# Patient Record
Sex: Male | Born: 1960
Health system: Southern US, Community
[De-identification: ages and names within clinical notes are randomized; demographics above are authoritative.]

## PROBLEM LIST (undated history)

## (undated) DIAGNOSIS — T753XXA Motion sickness, initial encounter: Secondary | ICD-10-CM

## (undated) DIAGNOSIS — R42 Dizziness and giddiness: Secondary | ICD-10-CM

## (undated) HISTORY — PX: GANGLION CYST EXCISION: SHX1691

---

## 1999-10-16 HISTORY — PX: VASECTOMY: SHX75

## 2003-10-16 HISTORY — PX: KNEE SURGERY: SHX244

## 2011-12-07 ENCOUNTER — Ambulatory Visit: Payer: Self-pay

## 2012-05-15 HISTORY — PX: COLONOSCOPY: SHX174

## 2012-05-15 LAB — HM COLONOSCOPY

## 2012-06-06 ENCOUNTER — Ambulatory Visit: Payer: Self-pay | Admitting: Unknown Physician Specialty

## 2014-06-24 LAB — TSH: TSH: 1.6 u[IU]/mL (ref 0.41–5.90)

## 2014-06-24 LAB — LIPID PANEL
Cholesterol: 200 mg/dL (ref 0–200)
HDL: 70 mg/dL (ref 35–70)
LDL Cholesterol: 119 mg/dL
Triglycerides: 56 mg/dL (ref 40–160)

## 2014-06-24 LAB — CBC AND DIFFERENTIAL: Hemoglobin: 15.1 g/dL (ref 13.5–17.5)

## 2014-06-24 LAB — BASIC METABOLIC PANEL
BUN: 22 mg/dL — AB (ref 4–21)
Creatinine: 0.9 mg/dL (ref 0.6–1.3)

## 2014-06-24 LAB — PSA: PSA: 0.9

## 2015-09-20 ENCOUNTER — Encounter: Payer: Self-pay | Admitting: Internal Medicine

## 2015-09-20 DIAGNOSIS — E785 Hyperlipidemia, unspecified: Secondary | ICD-10-CM | POA: Insufficient documentation

## 2015-09-20 DIAGNOSIS — J3089 Other allergic rhinitis: Secondary | ICD-10-CM | POA: Insufficient documentation

## 2015-09-20 DIAGNOSIS — Z87898 Personal history of other specified conditions: Secondary | ICD-10-CM | POA: Insufficient documentation

## 2015-11-23 ENCOUNTER — Encounter: Payer: Self-pay | Admitting: Internal Medicine

## 2015-11-23 ENCOUNTER — Ambulatory Visit (INDEPENDENT_AMBULATORY_CARE_PROVIDER_SITE_OTHER): Payer: 59 | Admitting: Internal Medicine

## 2015-11-23 VITALS — BP 104/66 | HR 56 | Ht 71.0 in | Wt 163.6 lb

## 2015-11-23 DIAGNOSIS — Z125 Encounter for screening for malignant neoplasm of prostate: Secondary | ICD-10-CM

## 2015-11-23 DIAGNOSIS — E785 Hyperlipidemia, unspecified: Secondary | ICD-10-CM | POA: Diagnosis not present

## 2015-11-23 DIAGNOSIS — Z0000 Encounter for general adult medical examination without abnormal findings: Secondary | ICD-10-CM | POA: Diagnosis not present

## 2015-11-23 DIAGNOSIS — J3089 Other allergic rhinitis: Secondary | ICD-10-CM

## 2015-11-23 LAB — POCT URINALYSIS DIPSTICK
Bilirubin, UA: NEGATIVE
Blood, UA: NEGATIVE
Glucose, UA: NEGATIVE
Ketones, UA: NEGATIVE
Leukocytes, UA: NEGATIVE
Nitrite, UA: NEGATIVE
Protein, UA: NEGATIVE
Spec Grav, UA: 1.02
Urobilinogen, UA: 0.2
pH, UA: 6.5

## 2015-11-23 NOTE — Progress Notes (Signed)
Date:  11/23/2015   Name:  Hunter Morgan   DOB:  1961/09/08   MRN:  [id number]   Chief Complaint: Annual Exam Hunter Morgan is a 55 y.o. male who presents today for his Complete Annual Exam. He feels well. He reports exercising regularly. He reports he is sleeping about 6 hours per night.     Colonoscopy was done in 2013 - due in 2023.  He sees an ophthalmologist regularly.  He has not been bothered much by seasonal allergies.   Review of Systems  Constitutional: Negative for chills, diaphoresis, appetite change, fatigue and unexpected weight change.  HENT: Negative for hearing loss, tinnitus, trouble swallowing and voice change.   Eyes: Negative for visual disturbance.  Respiratory: Negative for choking, shortness of breath and wheezing.   Cardiovascular: Negative for chest pain, palpitations and leg swelling.  Gastrointestinal: Negative for abdominal pain, diarrhea, constipation and blood in stool.  Genitourinary: Negative for dysuria, urgency, frequency, hematuria and difficulty urinating.  Musculoskeletal: Positive for back pain (low back muscular pain). Negative for myalgias and arthralgias.  Skin: Negative for color change and rash.  Allergic/Immunologic: Negative for environmental allergies.  Neurological: Negative for dizziness, syncope and headaches.  Hematological: Negative for adenopathy.  Psychiatric/Behavioral: Positive for sleep disturbance (only sleeps 6 hours per night). Negative for dysphoric mood. The patient is not nervous/anxious.     Patient Active Problem List   Diagnosis Date Noted  . Environmental and seasonal allergies 09/20/2015  . H/O motion sickness 09/20/2015  . Hyperlipidemia, mild 09/20/2015    Prior to Admission medications   Not on File    No Known Allergies  Past Surgical History  Procedure Laterality Date  . Colonoscopy  05/2012    normal  . Knee surgery Left 2005  . Ganglion cyst excision Left     wrist  . Vasectomy  2001     Social History  Substance Use Topics  . Smoking status: Never Smoker   . Smokeless tobacco: None  . Alcohol Use: 2.4 oz/week    4 Standard drinks or equivalent per week     Medication list has been reviewed and updated.   Physical Exam  Constitutional: He is oriented to person, place, and time. He appears well-developed and well-nourished.  HENT:  Head: Normocephalic.  Right Ear: Tympanic membrane, external ear and ear canal normal.  Left Ear: Tympanic membrane, external ear and ear canal normal.  Nose: Nose normal.  Mouth/Throat: Uvula is midline and oropharynx is clear and moist.  Eyes: Conjunctivae and EOM are normal. Pupils are equal, round, and reactive to light.  Neck: Normal range of motion. Neck supple. Carotid bruit is not present. No thyromegaly present.  Cardiovascular: Normal rate, regular rhythm, normal heart sounds and intact distal pulses.   Pulmonary/Chest: Effort normal and breath sounds normal. He has no wheezes. Right breast exhibits no mass. Left breast exhibits no mass.  Abdominal: Soft. Normal appearance and bowel sounds are normal. There is no hepatosplenomegaly. There is no tenderness.  Musculoskeletal: Normal range of motion.  Lymphadenopathy:    He has no cervical adenopathy.  Neurological: He is alert and oriented to person, place, and time. He has normal reflexes.  Skin: Skin is warm, dry and intact.  Psychiatric: He has a normal mood and affect. His speech is normal and behavior is normal. Judgment and thought content normal.  Nursing note and vitals reviewed.   BP 104/66 mmHg  Pulse 56  Ht 5\' 11"  (1.803 m)  Wt 163 lb 9.6 oz (74.208 kg)  BMI 22.83 kg/m2  Assessment and Plan: 1. Annual physical exam Normal exam Patient is up-to-date on colonoscopy, eye exam and annual skin survey TDap discussed - CBC with Differential/Platelet - Comprehensive metabolic panel - POCT urinalysis dipstick  2. Hyperlipidemia, mild Continue to monitor and  advise if medications are needed - Lipid panel  3. Environmental and seasonal allergies Stable without medications needed at this time  4. Prostate cancer screening DRE deferred due to lack of symptoms - PSA   Halina Maidens, MD [facility]  11/23/2015

## 2015-11-24 LAB — CBC WITH DIFFERENTIAL/PLATELET
Basophils Absolute: 0 10*3/uL (ref 0.0–0.2)
Basos: 0 %
EOS (ABSOLUTE): 0.2 10*3/uL (ref 0.0–0.4)
Eos: 3 %
Hematocrit: 43 % (ref 37.5–51.0)
Hemoglobin: 14.6 g/dL (ref 12.6–17.7)
Immature Grans (Abs): 0 10*3/uL (ref 0.0–0.1)
Immature Granulocytes: 0 %
Lymphocytes Absolute: 1.6 10*3/uL (ref 0.7–3.1)
Lymphs: 28 %
MCH: 33.9 pg — ABNORMAL HIGH (ref 26.6–33.0)
MCHC: 34 g/dL (ref 31.5–35.7)
MCV: 100 fL — ABNORMAL HIGH (ref 79–97)
Monocytes Absolute: 0.7 10*3/uL (ref 0.1–0.9)
Monocytes: 12 %
Neutrophils Absolute: 3.2 10*3/uL (ref 1.4–7.0)
Neutrophils: 57 %
Platelets: 254 10*3/uL (ref 150–379)
RBC: 4.31 x10E6/uL (ref 4.14–5.80)
RDW: 13.9 % (ref 12.3–15.4)
WBC: 5.6 10*3/uL (ref 3.4–10.8)

## 2015-11-24 LAB — LIPID PANEL
Chol/HDL Ratio: 2.8 ratio units (ref 0.0–5.0)
Cholesterol, Total: 163 mg/dL (ref 100–199)
HDL: 59 mg/dL (ref >39)
LDL Calculated: 96 mg/dL (ref 0–99)
Triglycerides: 40 mg/dL (ref 0–149)
VLDL Cholesterol Cal: 8 mg/dL (ref 5–40)

## 2015-11-24 LAB — COMPREHENSIVE METABOLIC PANEL
ALT: 18 IU/L (ref 0–44)
AST: 24 IU/L (ref 0–40)
Albumin/Globulin Ratio: 1.8 (ref 1.1–2.5)
Albumin: 4.5 g/dL (ref 3.5–5.5)
Alkaline Phosphatase: 59 IU/L (ref 39–117)
BUN/Creatinine Ratio: 18 (ref 9–20)
BUN: 12 mg/dL (ref 6–24)
Bilirubin Total: 0.8 mg/dL (ref 0.0–1.2)
CO2: 26 mmol/L (ref 18–29)
Calcium: 9.1 mg/dL (ref 8.7–10.2)
Chloride: 97 mmol/L (ref 96–106)
Creatinine, Ser: 0.68 mg/dL — ABNORMAL LOW (ref 0.76–1.27)
GFR calc Af Amer: 125 mL/min/{1.73_m2} (ref >59)
GFR calc non Af Amer: 108 mL/min/{1.73_m2} (ref >59)
Globulin, Total: 2.5 g/dL (ref 1.5–4.5)
Glucose: 69 mg/dL (ref 65–99)
Potassium: 4.4 mmol/L (ref 3.5–5.2)
Sodium: 140 mmol/L (ref 134–144)
Total Protein: 7 g/dL (ref 6.0–8.5)

## 2015-11-24 LAB — PSA: Prostate Specific Ag, Serum: 1 ng/mL (ref 0.0–4.0)

## 2015-11-25 ENCOUNTER — Telehealth: Payer: Self-pay

## 2015-11-25 NOTE — Telephone Encounter (Signed)
-----   Message from Glean Hess, MD sent at 11/24/2015  1:02 PM EST ----- Labs are normal.  Cholesterol is excellent!  PSA is normal.

## 2015-11-25 NOTE — Telephone Encounter (Signed)
Spoke with patient. Patient advised of all results and verbalized understanding. Will call back with any future questions or concerns. MAH  

## 2016-06-11 DIAGNOSIS — L578 Other skin changes due to chronic exposure to nonionizing radiation: Secondary | ICD-10-CM | POA: Diagnosis not present

## 2016-06-11 DIAGNOSIS — L859 Epidermal thickening, unspecified: Secondary | ICD-10-CM | POA: Diagnosis not present

## 2016-06-11 DIAGNOSIS — Z1283 Encounter for screening for malignant neoplasm of skin: Secondary | ICD-10-CM | POA: Diagnosis not present

## 2016-06-11 DIAGNOSIS — L821 Other seborrheic keratosis: Secondary | ICD-10-CM | POA: Diagnosis not present

## 2016-06-11 DIAGNOSIS — L219 Seborrheic dermatitis, unspecified: Secondary | ICD-10-CM | POA: Diagnosis not present

## 2016-06-11 DIAGNOSIS — L814 Other melanin hyperpigmentation: Secondary | ICD-10-CM | POA: Diagnosis not present

## 2016-06-11 DIAGNOSIS — L570 Actinic keratosis: Secondary | ICD-10-CM | POA: Diagnosis not present

## 2016-06-11 DIAGNOSIS — L608 Other nail disorders: Secondary | ICD-10-CM | POA: Diagnosis not present

## 2016-11-26 ENCOUNTER — Encounter: Payer: Self-pay | Admitting: Internal Medicine

## 2016-11-26 ENCOUNTER — Ambulatory Visit (INDEPENDENT_AMBULATORY_CARE_PROVIDER_SITE_OTHER): Payer: 59 | Admitting: Internal Medicine

## 2016-11-26 VITALS — BP 102/68 | HR 64 | Ht 71.0 in | Wt 171.8 lb

## 2016-11-26 DIAGNOSIS — Z0000 Encounter for general adult medical examination without abnormal findings: Secondary | ICD-10-CM | POA: Diagnosis not present

## 2016-11-26 DIAGNOSIS — Z23 Encounter for immunization: Secondary | ICD-10-CM | POA: Diagnosis not present

## 2016-11-26 DIAGNOSIS — E785 Hyperlipidemia, unspecified: Secondary | ICD-10-CM | POA: Diagnosis not present

## 2016-11-26 DIAGNOSIS — J3089 Other allergic rhinitis: Secondary | ICD-10-CM

## 2016-11-26 DIAGNOSIS — Z125 Encounter for screening for malignant neoplasm of prostate: Secondary | ICD-10-CM | POA: Diagnosis not present

## 2016-11-26 LAB — POCT URINALYSIS DIPSTICK
Bilirubin, UA: NEGATIVE
Blood, UA: NEGATIVE
Glucose, UA: NEGATIVE
Ketones, UA: NEGATIVE
Leukocytes, UA: NEGATIVE
Nitrite, UA: NEGATIVE
Protein, UA: NEGATIVE
Spec Grav, UA: 1.015
Urobilinogen, UA: 0.2
pH, UA: 5

## 2016-11-26 NOTE — Patient Instructions (Addendum)
Health Maintenance, Male A healthy lifestyle and preventative care can promote health and wellness.  Maintain regular health, dental, and eye exams.  Eat a healthy diet. Foods like vegetables, fruits, whole grains, low-fat dairy products, and lean protein foods contain the nutrients you need and are low in calories. Decrease your intake of foods high in solid fats, added sugars, and salt. Get information about a proper diet from your health care provider, if necessary.  Regular physical exercise is one of the most important things you can do for your health. Most adults should get at least 150 minutes of moderate-intensity exercise (any activity that increases your heart rate and causes you to sweat) each week. In addition, most adults need muscle-strengthening exercises on 2 or more days a week.   Maintain a healthy weight. The body mass index (BMI) is a screening tool to identify possible weight problems. It provides an estimate of body fat based on height and weight. Your health care provider can find your BMI and can help you achieve or maintain a healthy weight. For males 20 years and older:  A BMI below 18.5 is considered underweight.  A BMI of 18.5 to 24.9 is normal.  A BMI of 25 to 29.9 is considered overweight.  A BMI of 30 and above is considered obese.  Maintain normal blood lipids and cholesterol by exercising and minimizing your intake of saturated fat. Eat a balanced diet with plenty of fruits and vegetables. Blood tests for lipids and cholesterol should begin at age 52 and be repeated every 5 years. If your lipid or cholesterol levels are high, you are over age 59, or you are at high risk for heart disease, you may need your cholesterol levels checked more frequently.Ongoing high lipid and cholesterol levels should be treated with medicines if diet and exercise are not working.  If you smoke, find out from your health care provider how to quit. If you do not use tobacco, do  not start.  Lung cancer screening is recommended for adults aged 59-80 years who are at high risk for developing lung cancer because of a history of smoking. A yearly low-dose CT scan of the lungs is recommended for people who have at least a 30-pack-year history of smoking and are current smokers or have quit within the past 15 years. A pack year of smoking is smoking an average of 1 pack of cigarettes a day for 1 year (for example, a 30-pack-year history of smoking could mean smoking 1 pack a day for 30 years or 2 packs a day for 15 years). Yearly screening should continue until the smoker has stopped smoking for at least 15 years. Yearly screening should be stopped for people who develop a health problem that would prevent them from having lung cancer treatment.  If you choose to drink alcohol, do not have more than 2 drinks per day. One drink is considered to be 12 oz (360 mL) of beer, 5 oz (150 mL) of wine, or 1.5 oz (45 mL) of liquor.  Avoid the use of street drugs. Do not share needles with anyone. Ask for help if you need support or instructions about stopping the use of drugs.  High blood pressure causes heart disease and increases the risk of stroke. High blood pressure is more likely to develop in:  People who have blood pressure in the end of the normal range (100-139/85-89 mm Hg).  People who are overweight or obese.  People who are African American.  If you are 19-52 years of age, have your blood pressure checked every 3-5 years. If you are 79 years of age or older, have your blood pressure checked every year. You should have your blood pressure measured twice-once when you are at a hospital or clinic, and once when you are not at a hospital or clinic. Record the average of the two measurements. To check your blood pressure when you are not at a hospital or clinic, you can use:  An automated blood pressure machine at a pharmacy.  A home blood pressure monitor.  If you are 85-13  years old, ask your health care provider if you should take aspirin to prevent heart disease.  Diabetes screening involves taking a blood sample to check your fasting blood sugar level. This should be done once every 3 years after age 28 if you are at a normal weight and without risk factors for diabetes. Testing should be considered at a younger age or be carried out more frequently if you are overweight and have at least 1 risk factor for diabetes.  Colorectal cancer can be detected and often prevented. Most routine colorectal cancer screening begins at the age of 65 and continues through age 44. However, your health care provider may recommend screening at an earlier age if you have risk factors for colon cancer. On a yearly basis, your health care provider may provide home test kits to check for hidden blood in the stool. A small camera at the end of a tube may be used to directly examine the colon (sigmoidoscopy or colonoscopy) to detect the earliest forms of colorectal cancer. Talk to your health care provider about this at age 40 when routine screening begins. A direct exam of the colon should be repeated every 5-10 years through age 40, unless early forms of precancerous polyps or small growths are found.  People who are at an increased risk for hepatitis B should be screened for this virus. You are considered at high risk for hepatitis B if:  You were born in a country where hepatitis B occurs often. Talk with your health care provider about which countries are considered high risk.  Your parents were born in a high-risk country and you have not received a shot to protect against hepatitis B (hepatitis B vaccine).  You have HIV or AIDS.  You use needles to inject street drugs.  You live with, or have sex with, someone who has hepatitis B.  You are a man who has sex with other men (MSM).  You get hemodialysis treatment.  You take certain medicines for conditions like cancer, organ  transplantation, and autoimmune conditions.  Hepatitis C blood testing is recommended for all people born from [birth year] through 1965 and any individual with known risk factors for hepatitis C.  Healthy men should no longer receive prostate-specific antigen (PSA) blood tests as part of routine cancer screening. Talk to your health care provider about prostate cancer screening.  Testicular cancer screening is not recommended for adolescents or adult males who have no symptoms. Screening includes self-exam, a health care provider exam, and other screening tests. Consult with your health care provider about any symptoms you have or any concerns you have about testicular cancer.  Practice safe sex. Use condoms and avoid high-risk sexual practices to reduce the spread of sexually transmitted infections (STIs).  You should be screened for STIs, including gonorrhea and chlamydia if:  You are sexually active and are younger than 24 years.  You  are older than 24 years, and your health care provider tells you that you are at risk for this type of infection.  Your sexual activity has changed since you were last screened, and you are at an increased risk for chlamydia or gonorrhea. Ask your health care provider if you are at risk.  If you are at risk of being infected with HIV, it is recommended that you take a prescription medicine daily to prevent HIV infection. This is called pre-exposure prophylaxis (PrEP). You are considered at risk if:  You are a man who has sex with other men (MSM).  You are a heterosexual man who is sexually active with multiple partners.  You take drugs by injection.  You are sexually active with a partner who has HIV.  Talk with your health care provider about whether you are at high risk of being infected with HIV. If you choose to begin PrEP, you should first be tested for HIV. You should then be tested every 3 months for as long as you are taking PrEP.  Use sunscreen. Apply  sunscreen liberally and repeatedly throughout the day. You should seek shade when your shadow is shorter than you. Protect yourself by wearing long sleeves, pants, a wide-brimmed hat, and sunglasses year round whenever you are outdoors.  Tell your health care provider of new moles or changes in moles, especially if there is a change in shape or color. Also, tell your health care provider if a mole is larger than the size of a pencil eraser.  A one-time screening for abdominal aortic aneurysm (AAA) and surgical repair of large AAAs by ultrasound is recommended for men aged 33-75 years who are current or former smokers.  Stay current with your vaccines (immunizations). This information is not intended to replace advice given to you by your health care provider. Make sure you discuss any questions you have with your health care provider. Document Released: 03/29/2008 Document Revised: 10/22/2014 Document Reviewed: 07/05/2015 Elsevier Interactive Patient Education  2017 [employer]. Tdap Vaccine (Tetanus, Diphtheria and Pertussis): What You Need to Know 1. Why get vaccinated? Tetanus, diphtheria and pertussis are very serious diseases. Tdap vaccine can protect [place] from these diseases. And, Tdap vaccine given to pregnant women can protect newborn babies against pertussis. TETANUS (Lockjaw) is rare in the [place] today. It causes painful muscle tightening and stiffness, usually all over the body.  It can lead to tightening of muscles in the head and neck so you can't open your mouth, swallow, or sometimes even breathe. Tetanus kills about 1 out of 10 people who are infected even after receiving the best medical care. DIPHTHERIA is also rare in the [place] today. It can cause a thick coating to form in the back of the throat.  It can lead to breathing problems, heart failure, paralysis, and death. PERTUSSIS (Whooping Cough) causes severe coughing spells, which can cause difficulty breathing,  vomiting and disturbed sleep.  It can also lead to weight loss, incontinence, and rib fractures. Up to 2 in 100 adolescents and 5 in 100 adults with pertussis are hospitalized or have complications, which could include pneumonia or death. These diseases are caused by bacteria. Diphtheria and pertussis are spread from person to person through secretions from coughing or sneezing. Tetanus enters the body through cuts, scratches, or wounds. Before vaccines, as many as 200,000 cases of diphtheria, 200,000 cases of pertussis, and hundreds of cases of tetanus, were reported in the [place] each year. Since vaccination  began, reports of cases for tetanus and diphtheria have dropped by about 99% and for pertussis by about 80%. 2. Tdap vaccine Tdap vaccine can protect adolescents and adults from tetanus, diphtheria, and pertussis. One dose of Tdap is routinely given at age 71 or 49. People who did not get Tdap at that age should get it as soon as possible. Tdap is especially important for healthcare professionals and anyone having close contact with a baby younger than 12 months. Pregnant women should get a dose of Tdap during every pregnancy, to protect the newborn from pertussis. Infants are most at risk for severe, life-threatening complications from pertussis. Another vaccine, called Td, protects against tetanus and diphtheria, but not pertussis. A Td booster should be given every 10 years. Tdap may be given as one of these boosters if you have never gotten Tdap before. Tdap may also be given after a severe cut or burn to prevent tetanus infection. Your doctor or the person giving you the vaccine can give you more information. Tdap may safely be given at the same time as other vaccines. 3. Some people should not get this vaccine  A person who has ever had a life-threatening allergic reaction after a previous dose of any diphtheria, tetanus or pertussis containing vaccine, OR has a severe allergy to any  part of this vaccine, should not get Tdap vaccine. Tell the person giving the vaccine about any severe allergies.  Anyone who had coma or long repeated seizures within 7 days after a childhood dose of DTP or DTaP, or a previous dose of Tdap, should not get Tdap, unless a cause other than the vaccine was found. They can still get Td.  Talk to your doctor if you:  have seizures or another nervous system problem,  had severe pain or swelling after any vaccine containing diphtheria, tetanus or pertussis,  ever had a condition called Guillain-Barr Syndrome (GBS),  aren't feeling well on the day the shot is scheduled. 4. Risks With any medicine, including vaccines, there is a chance of side effects. These are usually mild and go away on their own. Serious reactions are also possible but are rare. Most people who get Tdap vaccine do not have any problems with it. Mild problems following Tdap: (Did not interfere with activities)  Pain where the shot was given (about 3 in 4 adolescents or 2 in 3 adults)  Redness or swelling where the shot was given (about 1 person in 5)  Mild fever of at least 100.40F (up to about 1 in 25 adolescents or 1 in 100 adults)  Headache (about 3 or 4 people in 10)  Tiredness (about 1 person in 3 or 4)  Nausea, vomiting, diarrhea, stomach ache (up to 1 in 4 adolescents or 1 in 10 adults)  Chills, sore joints (about 1 person in 10)  Body aches (about 1 person in 3 or 4)  Rash, swollen glands (uncommon) Moderate problems following Tdap: (Interfered with activities, but did not require medical attention)  Pain where the shot was given (up to 1 in 5 or 6)  Redness or swelling where the shot was given (up to about 1 in 16 adolescents or 1 in 12 adults)  Fever over 102F (about 1 in 100 adolescents or 1 in 250 adults)  Headache (about 1 in 7 adolescents or 1 in 10 adults)  Nausea, vomiting, diarrhea, stomach ache (up to 1 or 3 people in 100)  Swelling of  the entire arm where the shot was  given (up to about 1 in 500). Severe problems following Tdap: (Unable to perform usual activities; required medical attention)  Swelling, severe pain, bleeding and redness in the arm where the shot was given (rare). Problems that could happen after any vaccine:  People sometimes faint after a medical procedure, including vaccination. Sitting or lying down for about 15 minutes can help prevent fainting, and injuries caused by a fall. Tell your doctor if you feel dizzy, or have vision changes or ringing in the ears.  Some people get severe pain in the shoulder and have difficulty moving the arm where a shot was given. This happens very rarely.  Any medication can cause a severe allergic reaction. Such reactions from a vaccine are very rare, estimated at fewer than 1 in a million doses, and would happen within a few minutes to a few hours after the vaccination. As with any medicine, there is a very remote chance of a vaccine causing a serious injury or death. The safety of vaccines is always being monitored. For more information, visit: http://www.aguilar.org/ 5. What if there is a serious problem? What should I look for? Look for anything that concerns you, such as signs of a severe allergic reaction, very high fever, or unusual behavior. Signs of a severe allergic reaction can include hives, swelling of the face and throat, difficulty breathing, a fast heartbeat, dizziness, and weakness. These would usually start a few minutes to a few hours after the vaccination. What should I do?  If you think it is a severe allergic reaction or other emergency that can't wait, call 9-1-1 or get the person to the nearest hospital. Otherwise, call your doctor.  Afterward, the reaction should be reported to the Vaccine Adverse Event Reporting System (VAERS). Your doctor might file this report, or you can do it yourself through the VAERS web site at www.vaers.SamedayNews.es, or by  calling 816-217-6358.  VAERS does not give medical advice. 6. The National Vaccine Injury Compensation Program The [employer] Vaccine Injury Compensation Program (VICP) is a federal program that was created to compensate people who may have been injured by certain vaccines. Persons who believe they may have been injured by a vaccine can learn about the program and about filing a claim by calling (681)403-1154 or visiting the [employer] website at GoldCloset.com.ee. There is a time limit to file a claim for compensation. 7. How can I learn more?  Ask your doctor. He or she can give you the vaccine package insert or suggest other sources of information.  Call your local or state health department.  Contact the Centers for Disease Control and Prevention (CDC):  Call 346-453-5669 (1-800-CDC-INFO) or  Visit CDC's website at http://hunter.com/ CDC Tdap Vaccine VIS (12/08/13) This information is not intended to replace advice given to you by your health care provider. Make sure you discuss any questions you have with your health care provider. Document Released: 04/01/2012 Document Revised: 06/21/2016 Document Reviewed: 06/21/2016 Elsevier Interactive Patient Education  2017 [employer].
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
## 2016-11-27 LAB — COMPREHENSIVE METABOLIC PANEL
ALT: 15 IU/L (ref 0–44)
AST: 23 IU/L (ref 0–40)
Albumin/Globulin Ratio: 2.3 — ABNORMAL HIGH (ref 1.2–2.2)
Albumin: 4.5 g/dL (ref 3.5–5.5)
Alkaline Phosphatase: 55 IU/L (ref 39–117)
BUN/Creatinine Ratio: 17 (ref 9–20)
BUN: 15 mg/dL (ref 6–24)
Bilirubin Total: 0.7 mg/dL (ref 0.0–1.2)
CO2: 28 mmol/L (ref 18–29)
Calcium: 9.3 mg/dL (ref 8.7–10.2)
Chloride: 100 mmol/L (ref 96–106)
Creatinine, Ser: 0.89 mg/dL (ref 0.76–1.27)
GFR calc Af Amer: 111 mL/min/{1.73_m2} (ref >59)
GFR calc non Af Amer: 96 mL/min/{1.73_m2} (ref >59)
Globulin, Total: 2 g/dL (ref 1.5–4.5)
Glucose: 101 mg/dL — ABNORMAL HIGH (ref 65–99)
Potassium: 4.9 mmol/L (ref 3.5–5.2)
Sodium: 141 mmol/L (ref 134–144)
Total Protein: 6.5 g/dL (ref 6.0–8.5)

## 2016-11-27 LAB — CBC WITH DIFFERENTIAL/PLATELET
Basophils Absolute: 0 10*3/uL (ref 0.0–0.2)
Basos: 0 %
EOS (ABSOLUTE): 0.2 10*3/uL (ref 0.0–0.4)
Eos: 5 %
Hematocrit: 41.9 % (ref 37.5–51.0)
Hemoglobin: 14.2 g/dL (ref 13.0–17.7)
Immature Grans (Abs): 0 10*3/uL (ref 0.0–0.1)
Immature Granulocytes: 0 %
Lymphocytes Absolute: 1.5 10*3/uL (ref 0.7–3.1)
Lymphs: 31 %
MCH: 34 pg — ABNORMAL HIGH (ref 26.6–33.0)
MCHC: 33.9 g/dL (ref 31.5–35.7)
MCV: 100 fL — ABNORMAL HIGH (ref 79–97)
Monocytes Absolute: 0.5 10*3/uL (ref 0.1–0.9)
Monocytes: 10 %
Neutrophils Absolute: 2.5 10*3/uL (ref 1.4–7.0)
Neutrophils: 54 %
Platelets: 261 10*3/uL (ref 150–379)
RBC: 4.18 x10E6/uL (ref 4.14–5.80)
RDW: 14.5 % (ref 12.3–15.4)
WBC: 4.7 10*3/uL (ref 3.4–10.8)

## 2016-11-27 LAB — LIPID PANEL
Chol/HDL Ratio: 2.6 ratio units (ref 0.0–5.0)
Cholesterol, Total: 177 mg/dL (ref 100–199)
HDL: 67 mg/dL (ref >39)
LDL Calculated: 100 mg/dL — ABNORMAL HIGH (ref 0–99)
Triglycerides: 48 mg/dL (ref 0–149)
VLDL Cholesterol Cal: 10 mg/dL (ref 5–40)

## 2016-11-27 LAB — PSA: Prostate Specific Ag, Serum: 1 ng/mL (ref 0.0–4.0)

## 2017-06-24 DIAGNOSIS — L739 Follicular disorder, unspecified: Secondary | ICD-10-CM | POA: Diagnosis not present

## 2017-06-24 DIAGNOSIS — L814 Other melanin hyperpigmentation: Secondary | ICD-10-CM | POA: Diagnosis not present

## 2017-06-24 DIAGNOSIS — L219 Seborrheic dermatitis, unspecified: Secondary | ICD-10-CM | POA: Diagnosis not present

## 2017-06-24 DIAGNOSIS — L918 Other hypertrophic disorders of the skin: Secondary | ICD-10-CM | POA: Diagnosis not present

## 2017-06-24 DIAGNOSIS — D229 Melanocytic nevi, unspecified: Secondary | ICD-10-CM | POA: Diagnosis not present

## 2017-06-24 DIAGNOSIS — L578 Other skin changes due to chronic exposure to nonionizing radiation: Secondary | ICD-10-CM | POA: Diagnosis not present

## 2017-06-24 DIAGNOSIS — L821 Other seborrheic keratosis: Secondary | ICD-10-CM | POA: Diagnosis not present

## 2017-06-24 DIAGNOSIS — Z1283 Encounter for screening for malignant neoplasm of skin: Secondary | ICD-10-CM | POA: Diagnosis not present

## 2017-06-24 DIAGNOSIS — L570 Actinic keratosis: Secondary | ICD-10-CM | POA: Diagnosis not present

## 2017-07-22 DIAGNOSIS — H5213 Myopia, bilateral: Secondary | ICD-10-CM | POA: Diagnosis not present

## 2017-11-29 ENCOUNTER — Ambulatory Visit
Admission: RE | Admit: 2017-11-29 | Discharge: 2017-11-29 | Disposition: A | Discharge status: Home / Self Care | Payer: 59 | Source: Ambulatory Visit | Attending: Internal Medicine | Admitting: Internal Medicine

## 2017-11-29 ENCOUNTER — Other Ambulatory Visit: Payer: Self-pay | Admitting: Internal Medicine

## 2017-11-29 ENCOUNTER — Ambulatory Visit (INDEPENDENT_AMBULATORY_CARE_PROVIDER_SITE_OTHER): Payer: 59 | Admitting: Internal Medicine

## 2017-11-29 ENCOUNTER — Encounter: Payer: Self-pay | Admitting: Internal Medicine

## 2017-11-29 VITALS — BP 112/80 | HR 53 | Ht 71.0 in | Wt 175.0 lb

## 2017-11-29 DIAGNOSIS — Z125 Encounter for screening for malignant neoplasm of prostate: Secondary | ICD-10-CM | POA: Diagnosis not present

## 2017-11-29 DIAGNOSIS — M79672 Pain in left foot: Secondary | ICD-10-CM | POA: Insufficient documentation

## 2017-11-29 DIAGNOSIS — G8929 Other chronic pain: Secondary | ICD-10-CM | POA: Insufficient documentation

## 2017-11-29 DIAGNOSIS — Z0001 Encounter for general adult medical examination with abnormal findings: Secondary | ICD-10-CM

## 2017-11-29 DIAGNOSIS — E785 Hyperlipidemia, unspecified: Secondary | ICD-10-CM

## 2017-11-29 DIAGNOSIS — J3089 Other allergic rhinitis: Secondary | ICD-10-CM

## 2017-11-29 DIAGNOSIS — Z0000 Encounter for general adult medical examination without abnormal findings: Secondary | ICD-10-CM

## 2017-11-29 DIAGNOSIS — M7989 Other specified soft tissue disorders: Secondary | ICD-10-CM | POA: Diagnosis not present

## 2017-11-29 LAB — POCT URINALYSIS DIPSTICK
Bilirubin, UA: NEGATIVE
Blood, UA: NEGATIVE
Glucose, UA: NEGATIVE
Ketones, UA: NEGATIVE
Leukocytes, UA: NEGATIVE
Nitrite, UA: NEGATIVE
Protein, UA: NEGATIVE
Spec Grav, UA: 1.015 (ref 1.010–1.025)
Urobilinogen, UA: 0.2 E.U./dL
pH, UA: 6 (ref 5.0–8.0)

## 2017-11-29 NOTE — Patient Instructions (Signed)
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
## 2017-11-30 ENCOUNTER — Other Ambulatory Visit: Payer: Self-pay | Admitting: Internal Medicine

## 2017-11-30 DIAGNOSIS — M79672 Pain in left foot: Principal | ICD-10-CM

## 2017-11-30 DIAGNOSIS — G8929 Other chronic pain: Secondary | ICD-10-CM

## 2017-11-30 LAB — COMPREHENSIVE METABOLIC PANEL
ALT: 19 IU/L (ref 0–44)
AST: 23 IU/L (ref 0–40)
Albumin/Globulin Ratio: 2 (ref 1.2–2.2)
Albumin: 4.9 g/dL (ref 3.5–5.5)
Alkaline Phosphatase: 66 IU/L (ref 39–117)
BUN/Creatinine Ratio: 19 (ref 9–20)
BUN: 14 mg/dL (ref 6–24)
Bilirubin Total: 0.7 mg/dL (ref 0.0–1.2)
CO2: 24 mmol/L (ref 20–29)
Calcium: 9.6 mg/dL (ref 8.7–10.2)
Chloride: 100 mmol/L (ref 96–106)
Creatinine, Ser: 0.74 mg/dL — ABNORMAL LOW (ref 0.76–1.27)
GFR calc Af Amer: 119 mL/min/{1.73_m2} (ref >59)
GFR calc non Af Amer: 103 mL/min/{1.73_m2} (ref >59)
Globulin, Total: 2.5 g/dL (ref 1.5–4.5)
Glucose: 77 mg/dL (ref 65–99)
Potassium: 4.9 mmol/L (ref 3.5–5.2)
Sodium: 141 mmol/L (ref 134–144)
Total Protein: 7.4 g/dL (ref 6.0–8.5)

## 2017-11-30 LAB — CBC WITH DIFFERENTIAL/PLATELET
Basophils Absolute: 0 10*3/uL (ref 0.0–0.2)
Basos: 0 %
EOS (ABSOLUTE): 0.3 10*3/uL (ref 0.0–0.4)
Eos: 4 %
Hematocrit: 46.4 % (ref 37.5–51.0)
Hemoglobin: 15.2 g/dL (ref 13.0–17.7)
Immature Grans (Abs): 0 10*3/uL (ref 0.0–0.1)
Immature Granulocytes: 0 %
Lymphocytes Absolute: 2.1 10*3/uL (ref 0.7–3.1)
Lymphs: 31 %
MCH: 33.6 pg — ABNORMAL HIGH (ref 26.6–33.0)
MCHC: 32.8 g/dL (ref 31.5–35.7)
MCV: 102 fL — ABNORMAL HIGH (ref 79–97)
Monocytes Absolute: 0.7 10*3/uL (ref 0.1–0.9)
Monocytes: 11 %
Neutrophils Absolute: 3.5 10*3/uL (ref 1.4–7.0)
Neutrophils: 54 %
Platelets: 269 10*3/uL (ref 150–379)
RBC: 4.53 x10E6/uL (ref 4.14–5.80)
RDW: 14.2 % (ref 12.3–15.4)
WBC: 6.6 10*3/uL (ref 3.4–10.8)

## 2017-11-30 LAB — LIPID PANEL
Chol/HDL Ratio: 3.2 ratio (ref 0.0–5.0)
Cholesterol, Total: 187 mg/dL (ref 100–199)
HDL: 59 mg/dL (ref >39)
LDL Calculated: 116 mg/dL — ABNORMAL HIGH (ref 0–99)
Triglycerides: 61 mg/dL (ref 0–149)
VLDL Cholesterol Cal: 12 mg/dL (ref 5–40)

## 2017-11-30 LAB — PSA: Prostate Specific Ag, Serum: 0.9 ng/mL (ref 0.0–4.0)

## 2017-12-04 DIAGNOSIS — G5762 Lesion of plantar nerve, left lower limb: Secondary | ICD-10-CM | POA: Diagnosis not present

## 2017-12-25 DIAGNOSIS — G5762 Lesion of plantar nerve, left lower limb: Secondary | ICD-10-CM | POA: Diagnosis not present

## 2018-06-24 DIAGNOSIS — L820 Inflamed seborrheic keratosis: Secondary | ICD-10-CM | POA: Diagnosis not present

## 2018-06-24 DIAGNOSIS — L739 Follicular disorder, unspecified: Secondary | ICD-10-CM | POA: Diagnosis not present

## 2018-06-24 DIAGNOSIS — D2239 Melanocytic nevi of other parts of face: Secondary | ICD-10-CM | POA: Diagnosis not present

## 2018-06-24 DIAGNOSIS — L578 Other skin changes due to chronic exposure to nonionizing radiation: Secondary | ICD-10-CM | POA: Diagnosis not present

## 2018-06-24 DIAGNOSIS — L812 Freckles: Secondary | ICD-10-CM | POA: Diagnosis not present

## 2018-06-24 DIAGNOSIS — L821 Other seborrheic keratosis: Secondary | ICD-10-CM | POA: Diagnosis not present

## 2018-06-24 DIAGNOSIS — L219 Seborrheic dermatitis, unspecified: Secondary | ICD-10-CM | POA: Diagnosis not present

## 2018-06-24 DIAGNOSIS — Z1283 Encounter for screening for malignant neoplasm of skin: Secondary | ICD-10-CM | POA: Diagnosis not present

## 2018-12-04 ENCOUNTER — Encounter: Payer: Self-pay | Admitting: Internal Medicine

## 2018-12-04 ENCOUNTER — Other Ambulatory Visit: Payer: Self-pay

## 2018-12-04 ENCOUNTER — Other Ambulatory Visit
Admission: RE | Admit: 2018-12-04 | Discharge: 2018-12-04 | Disposition: A | Discharge status: Home / Self Care | Payer: 59 | Attending: Internal Medicine | Admitting: Internal Medicine

## 2018-12-04 ENCOUNTER — Ambulatory Visit (INDEPENDENT_AMBULATORY_CARE_PROVIDER_SITE_OTHER): Payer: 59 | Admitting: Internal Medicine

## 2018-12-04 VITALS — BP 112/70 | HR 67 | Ht 71.0 in | Wt 179.0 lb

## 2018-12-04 DIAGNOSIS — Z0000 Encounter for general adult medical examination without abnormal findings: Secondary | ICD-10-CM | POA: Insufficient documentation

## 2018-12-04 DIAGNOSIS — Z125 Encounter for screening for malignant neoplasm of prostate: Secondary | ICD-10-CM

## 2018-12-04 DIAGNOSIS — E785 Hyperlipidemia, unspecified: Secondary | ICD-10-CM

## 2018-12-04 LAB — CBC WITH DIFFERENTIAL/PLATELET
Abs Immature Granulocytes: 0.01 10*3/uL (ref 0.00–0.07)
Basophils Absolute: 0 10*3/uL (ref 0.0–0.1)
Basophils Relative: 1 %
Eosinophils Absolute: 0.2 10*3/uL (ref 0.0–0.5)
Eosinophils Relative: 4 %
HCT: 44 % (ref 39.0–52.0)
Hemoglobin: 14.8 g/dL (ref 13.0–17.0)
Immature Granulocytes: 0 %
Lymphocytes Relative: 30 %
Lymphs Abs: 1.5 10*3/uL (ref 0.7–4.0)
MCH: 33.6 pg (ref 26.0–34.0)
MCHC: 33.6 g/dL (ref 30.0–36.0)
MCV: 100 fL (ref 80.0–100.0)
Monocytes Absolute: 0.5 10*3/uL (ref 0.1–1.0)
Monocytes Relative: 11 %
Neutro Abs: 2.7 10*3/uL (ref 1.7–7.7)
Neutrophils Relative %: 54 %
Platelets: 248 10*3/uL (ref 150–400)
RBC: 4.4 MIL/uL (ref 4.22–5.81)
RDW: 13.2 % (ref 11.5–15.5)
WBC: 5 10*3/uL (ref 4.0–10.5)
nRBC: 0 % (ref 0.0–0.2)

## 2018-12-04 LAB — COMPREHENSIVE METABOLIC PANEL
ALT: 29 U/L (ref 0–44)
AST: 33 U/L (ref 15–41)
Albumin: 4.6 g/dL (ref 3.5–5.0)
Alkaline Phosphatase: 63 U/L (ref 38–126)
Anion gap: 7 (ref 5–15)
BUN: 16 mg/dL (ref 6–20)
CO2: 29 mmol/L (ref 22–32)
Calcium: 9.3 mg/dL (ref 8.9–10.3)
Chloride: 102 mmol/L (ref 98–111)
Creatinine, Ser: 0.73 mg/dL (ref 0.61–1.24)
GFR calc Af Amer: >60 mL/min (ref >60)
GFR calc non Af Amer: >60 mL/min (ref >60)
Glucose, Bld: 106 mg/dL — ABNORMAL HIGH (ref 70–99)
Potassium: 4.8 mmol/L (ref 3.5–5.1)
Sodium: 138 mmol/L (ref 135–145)
Total Bilirubin: 1 mg/dL (ref 0.3–1.2)
Total Protein: 8 g/dL (ref 6.5–8.1)

## 2018-12-04 LAB — LIPID PANEL
Cholesterol: 196 mg/dL (ref 0–200)
HDL: 64 mg/dL (ref >40)
LDL Cholesterol: 123 mg/dL — ABNORMAL HIGH (ref 0–99)
Total CHOL/HDL Ratio: 3.1 RATIO
Triglycerides: 46 mg/dL (ref <150)
VLDL: 9 mg/dL (ref 0–40)

## 2018-12-04 LAB — POCT URINALYSIS DIPSTICK
Bilirubin, UA: NEGATIVE
Blood, UA: NEGATIVE
Glucose, UA: NEGATIVE
Ketones, UA: NEGATIVE
Leukocytes, UA: NEGATIVE
Nitrite, UA: NEGATIVE
Protein, UA: NEGATIVE
Spec Grav, UA: 1.01 (ref 1.010–1.025)
Urobilinogen, UA: 0.2 E.U./dL
pH, UA: 5 (ref 5.0–8.0)

## 2018-12-04 LAB — PSA: Prostatic Specific Antigen: 0.74 ng/mL (ref 0.00–4.00)
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
## 2019-03-06 ENCOUNTER — Encounter: Payer: Self-pay | Admitting: Emergency Medicine

## 2019-03-06 ENCOUNTER — Ambulatory Visit (INDEPENDENT_AMBULATORY_CARE_PROVIDER_SITE_OTHER): Payer: 59

## 2019-03-06 ENCOUNTER — Ambulatory Visit
Admission: EM | Admit: 2019-03-06 | Discharge: 2019-03-06 | Disposition: A | Discharge status: Home / Self Care | Payer: 59 | Attending: Family Medicine | Admitting: Family Medicine

## 2019-03-06 ENCOUNTER — Other Ambulatory Visit: Payer: Self-pay

## 2019-03-06 DIAGNOSIS — S8392XA Sprain of unspecified site of left knee, initial encounter: Secondary | ICD-10-CM | POA: Diagnosis not present

## 2019-03-06 DIAGNOSIS — S8002XA Contusion of left knee, initial encounter: Secondary | ICD-10-CM

## 2019-03-06 DIAGNOSIS — M25562 Pain in left knee: Secondary | ICD-10-CM | POA: Diagnosis not present

## 2019-03-06 NOTE — Discharge Instructions (Addendum)
X-ray was negative for fracture
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
## 2019-06-11 ENCOUNTER — Other Ambulatory Visit: Payer: Self-pay

## 2019-06-11 ENCOUNTER — Ambulatory Visit (INDEPENDENT_AMBULATORY_CARE_PROVIDER_SITE_OTHER): Payer: 59 | Admitting: Internal Medicine

## 2019-06-11 ENCOUNTER — Encounter: Payer: Self-pay | Admitting: Internal Medicine

## 2019-06-11 VITALS — BP 124/80 | HR 77 | Ht 71.0 in | Wt 177.4 lb

## 2019-06-11 DIAGNOSIS — N529 Male erectile dysfunction, unspecified: Secondary | ICD-10-CM | POA: Insufficient documentation

## 2019-06-11 MED ORDER — SILDENAFIL CITRATE 20 MG PO TABS: 20.0000 mg | ORAL_TABLET | Freq: Every day | ORAL | 0 refills | Status: DC | PRN

## 2019-06-11 NOTE — Progress Notes (Signed)
Date:  06/11/2019   Name:  Hunter Morgan   DOB:  12-23-1960   MRN:  [id number]   Chief Complaint: Erectile Dysfunction (New issue. )  Erectile Dysfunction The current episode started more than 1 year ago. The problem has been gradually worsening since onset. He reports no anxiety or decreased libido. Irritative symptoms do not include frequency or nocturia. Obstructive symptoms do not include an intermittent stream. Pertinent negatives include no chills or dysuria. Past treatments include nothing. There are no known risk factors.    Review of Systems  Constitutional: Negative for chills, fatigue and fever.  Respiratory: Negative for chest tightness, shortness of breath and wheezing.   Cardiovascular: Negative for chest pain.  Genitourinary: Negative for decreased libido, difficulty urinating, dysuria, frequency, nocturia, penile pain and penile swelling.  Neurological: Negative for dizziness, light-headedness and headaches.  Psychiatric/Behavioral: The patient is not nervous/anxious.     Patient Active Problem List   Diagnosis Date Noted  . Environmental and seasonal allergies 09/20/2015  . H/O motion sickness 09/20/2015  . Hyperlipidemia, mild 09/20/2015    No Known Allergies  Past Surgical History:  Procedure Laterality Date  . COLONOSCOPY  05/2012   normal  . GANGLION CYST EXCISION Left    wrist  . KNEE SURGERY Left 2005  . VASECTOMY  2001    Social History   Tobacco Use  . Smoking status: Never Smoker  . Smokeless tobacco: Never Used  Substance Use Topics  . Alcohol use: Yes    Alcohol/week: 4.0 standard drinks    Types: 4 Standard drinks or equivalent per week  . Drug use: No     Medication list has been reviewed and updated.  No outpatient medications have been marked as taking for the 06/11/19 encounter (Office Visit) with Glean Hess, MD.    [facility] 2/9 Scores 06/11/2019 12/04/2018 11/29/2017  PHQ - 2 Score 0 0 0  PHQ- 9 Score - - 0    BP  Readings from Last 3 Encounters:  06/11/19 124/80  03/06/19 116/77  12/04/18 112/70    Physical Exam Vitals signs and nursing note reviewed.  Constitutional:      General: He is not in acute distress.    Appearance: Normal appearance. He is well-developed.  HENT:     Head: Normocephalic and atraumatic.  Neck:     Musculoskeletal: Normal range of motion.  Cardiovascular:     Rate and Rhythm: Normal rate and regular rhythm.     Heart sounds: No murmur.  Pulmonary:     Effort: Pulmonary effort is normal. No respiratory distress.     Breath sounds: No wheezing or rhonchi.  Musculoskeletal: Normal range of motion.     Right lower leg: No edema.     Left lower leg: No edema.  Lymphadenopathy:     Cervical: No cervical adenopathy.  Skin:    General: Skin is warm and dry.     Capillary Refill: Capillary refill takes less than 2 seconds.     Findings: No rash.  Neurological:     Mental Status: He is alert and oriented to person, place, and time.  Psychiatric:        Behavior: Behavior normal.        Thought Content: Thought content normal.     Wt Readings from Last 3 Encounters:  06/11/19 177 lb 6.4 oz (80.5 kg)  03/06/19 170 lb (77.1 kg)  12/04/18 179 lb (81.2 kg)    BP 124/80  Pulse 77   Ht 5\' 11"  (1.803 m)   Wt 177 lb 6.4 oz (80.5 kg)   SpO2 96%   BMI 24.74 kg/m   Assessment and Plan: 1. Erectile dysfunction, unspecified erectile dysfunction type Normal exam and history with no worrisome symptoms Begin sildenafil 20-40 mg daily PRN Warnings and precautions given - sildenafil (REVATIO) 20 MG tablet; Take 1 tablet (20 mg total) by mouth daily as needed.  Dispense: 10 tablet; Refill: 0   Partially dictated using Editor, commissioning. Any errors are unintentional.  Halina Maidens, MD [facility]  06/11/2019

## 2019-07-14 ENCOUNTER — Other Ambulatory Visit: Payer: Self-pay

## 2019-07-14 DIAGNOSIS — D1800 Hemangioma unspecified site: Secondary | ICD-10-CM | POA: Diagnosis not present

## 2019-07-14 DIAGNOSIS — L219 Seborrheic dermatitis, unspecified: Secondary | ICD-10-CM | POA: Diagnosis not present

## 2019-07-14 DIAGNOSIS — L814 Other melanin hyperpigmentation: Secondary | ICD-10-CM | POA: Diagnosis not present

## 2019-07-14 DIAGNOSIS — L820 Inflamed seborrheic keratosis: Secondary | ICD-10-CM | POA: Diagnosis not present

## 2019-07-14 DIAGNOSIS — L738 Other specified follicular disorders: Secondary | ICD-10-CM | POA: Diagnosis not present

## 2019-07-14 DIAGNOSIS — N529 Male erectile dysfunction, unspecified: Secondary | ICD-10-CM

## 2019-07-14 DIAGNOSIS — L570 Actinic keratosis: Secondary | ICD-10-CM | POA: Diagnosis not present

## 2019-07-14 DIAGNOSIS — B351 Tinea unguium: Secondary | ICD-10-CM | POA: Diagnosis not present

## 2019-07-14 DIAGNOSIS — D225 Melanocytic nevi of trunk: Secondary | ICD-10-CM | POA: Diagnosis not present

## 2019-07-14 DIAGNOSIS — Z1283 Encounter for screening for malignant neoplasm of skin: Secondary | ICD-10-CM | POA: Diagnosis not present

## 2019-07-14 MED ORDER — SILDENAFIL CITRATE 20 MG PO TABS: 20.0000 mg | ORAL_TABLET | Freq: Every day | ORAL | 0 refills | Status: DC | PRN

## 2019-09-24 ENCOUNTER — Other Ambulatory Visit: Payer: Self-pay | Admitting: Internal Medicine

## 2019-09-24 DIAGNOSIS — N529 Male erectile dysfunction, unspecified: Secondary | ICD-10-CM

## 2019-10-05 ENCOUNTER — Telehealth: Payer: Self-pay

## 2019-10-05 NOTE — Telephone Encounter (Signed)
Pt called regarding his medication Sildenafil with concerns of recent vision issues.  He wonders if the medication is causing that since he read it may be a side effect.

## 2019-10-06 NOTE — Telephone Encounter (Signed)
He should not take the medication any more and see his eye doctor for a thorough exam.

## 2019-10-23 DIAGNOSIS — Z23 Encounter for immunization: Secondary | ICD-10-CM | POA: Diagnosis not present
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
## 2019-11-20 DIAGNOSIS — Z23 Encounter for immunization: Secondary | ICD-10-CM | POA: Diagnosis not present

## 2019-12-07 ENCOUNTER — Ambulatory Visit (INDEPENDENT_AMBULATORY_CARE_PROVIDER_SITE_OTHER): Payer: 59 | Admitting: Internal Medicine

## 2019-12-07 ENCOUNTER — Other Ambulatory Visit: Payer: Self-pay

## 2019-12-07 ENCOUNTER — Encounter: Payer: Self-pay | Admitting: Internal Medicine

## 2019-12-07 VITALS — BP 118/78 | HR 79 | Temp 98.0°F | Ht 71.0 in | Wt 170.0 lb

## 2019-12-07 DIAGNOSIS — L309 Dermatitis, unspecified: Secondary | ICD-10-CM | POA: Diagnosis not present

## 2019-12-07 DIAGNOSIS — Z0000 Encounter for general adult medical examination without abnormal findings: Secondary | ICD-10-CM

## 2019-12-07 DIAGNOSIS — E785 Hyperlipidemia, unspecified: Secondary | ICD-10-CM

## 2019-12-07 DIAGNOSIS — Z125 Encounter for screening for malignant neoplasm of prostate: Secondary | ICD-10-CM

## 2019-12-07 LAB — POCT URINALYSIS DIPSTICK
Bilirubin, UA: NEGATIVE
Blood, UA: NEGATIVE
Glucose, UA: NEGATIVE
Ketones, UA: NEGATIVE
Leukocytes, UA: NEGATIVE
Nitrite, UA: NEGATIVE
Protein, UA: NEGATIVE
Spec Grav, UA: 1.02 (ref 1.010–1.025)
Urobilinogen, UA: 0.2 E.U./dL
pH, UA: 6 (ref 5.0–8.0)

## 2019-12-07 NOTE — Progress Notes (Signed)
Date:  12/07/2019   Name:  Hunter Morgan   DOB:  01-27-1961   MRN:  [id number]   Chief Complaint: Annual Exam (Pt will no longer be seeing [facility] for many personal reasons. Wants to switch to [facility]. Will call back later if he needs a referral. ) and Erectile Dysfunction (Has not been able to take Sidenifil because he seen he cannot take ketaconazole with it. Wants to discuss. ) Hunter Morgan is a 59 y.o. male who presents today for his Complete Annual Exam. He feels well. He reports exercising regularly. He reports he is sleeping well. 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  Colonoscopy 2013 Immunization History  Administered Date(s) Administered  . Influenza-Unspecified 07/16/2015, 07/26/2019  . Moderna SARS-COVID-2 Vaccination 10/23/2019, 11/20/2019  . Tdap 11/26/2016    Rash This is a recurrent problem. The problem has been waxing and waning since onset. The affected locations include the face and chest. The rash is characterized by redness, itchiness and scaling. Pertinent negatives include no diarrhea, fatigue or shortness of breath. Past treatments include topical steroids and moisturizer. The treatment provided mild relief.    Lab Results  Component Value Date   CREATININE 0.73 12/04/2018   BUN 16 12/04/2018   NA 138 12/04/2018   K 4.8 12/04/2018   CL 102 12/04/2018   CO2 29 12/04/2018   Lab Results  Component Value Date   CHOL 196 12/04/2018   HDL 64 12/04/2018   LDLCALC 123 (H) 12/04/2018   TRIG 46 12/04/2018   CHOLHDL 3.1 12/04/2018   Lab Results  Component Value Date   TSH 1.60 06/24/2014   No results found for: HGBA1C   Review of Systems  Constitutional: Negative for appetite change, chills, diaphoresis, fatigue and unexpected weight change.  HENT: Negative for hearing loss, tinnitus, trouble swallowing and voice change.   Eyes: Negative for visual disturbance.  Respiratory:  Negative for choking, shortness of breath and wheezing.   Cardiovascular: Negative for chest pain, palpitations and leg swelling.  Gastrointestinal: Negative for abdominal pain, blood in stool, constipation and diarrhea.  Genitourinary: Negative for decreased urine volume, difficulty urinating, dysuria, frequency and urgency.  Musculoskeletal: Negative for arthralgias, back pain and myalgias.  Skin: Positive for rash. Negative for color change.  Neurological: Negative for dizziness, syncope and headaches.  Hematological: Negative for adenopathy.  Psychiatric/Behavioral: Negative for dysphoric mood and sleep disturbance.    Patient Active Problem List   Diagnosis Date Noted  . Erectile dysfunction 06/11/2019  . Environmental and seasonal allergies 09/20/2015  . H/O motion sickness 09/20/2015  . Hyperlipidemia, mild 09/20/2015    No Known Allergies  Past Surgical History:  Procedure Laterality Date  . COLONOSCOPY  05/2012   normal  . GANGLION CYST EXCISION Left    wrist  . KNEE SURGERY Left 2005  . VASECTOMY  2001    Social History   Tobacco Use  . Smoking status: Never Smoker  . Smokeless tobacco: Never Used  Substance Use Topics  . Alcohol use: Yes    Alcohol/week: 4.0 standard drinks    Types: 4 Standard drinks or equivalent per week  . Drug use: No     Medication list has been reviewed and updated.  Current Meds  Medication Sig  . hydrocortisone 2.5 % cream   . ketoconazole (NIZORAL) 2 % cream Apply 1 application topically daily.  . sildenafil (REVATIO) 20 MG tablet TAKE 1  TABLET BY MOUTH DAILY AS NEEDED.    PHQ 2/9 Scores 12/07/2019 06/11/2019 12/04/2018 11/29/2017  PHQ - 2 Score 0 0 0 0  PHQ- 9 Score 0 - - 0    BP Readings from Last 3 Encounters:  12/07/19 118/78  06/11/19 124/80  03/06/19 116/77    Physical Exam Vitals and nursing note reviewed.  Constitutional:      Appearance: Normal appearance. He is well-developed.  HENT:     Head:  Normocephalic.     Right Ear: Tympanic membrane, ear canal and external ear normal.     Left Ear: Tympanic membrane, ear canal and external ear normal.     Nose: Nose normal.     Mouth/Throat:     Pharynx: Uvula midline.  Eyes:     General: Lids are normal.     Extraocular Movements: Extraocular movements intact.     Conjunctiva/sclera: Conjunctivae normal.     Pupils: Pupils are equal, round, and reactive to light.  Neck:     Thyroid: No thyromegaly.     Vascular: No carotid bruit.  Cardiovascular:     Rate and Rhythm: Normal rate and regular rhythm.     Heart sounds: Normal heart sounds.  Pulmonary:     Effort: Pulmonary effort is normal.     Breath sounds: Normal breath sounds. No wheezing.  Chest:     Breasts:        Right: No mass.        Left: No mass.  Abdominal:     General: Bowel sounds are normal.     Palpations: Abdomen is soft.     Tenderness: There is no abdominal tenderness.  Musculoskeletal:        General: Normal range of motion.     Cervical back: Normal range of motion and neck supple.  Lymphadenopathy:     Cervical: No cervical adenopathy.  Skin:    General: Skin is warm and dry.     Capillary Refill: Capillary refill takes less than 2 seconds.     Findings: Erythema and rash present. Rash is scaling.     Comments: On forehead and anterior chest   Neurological:     General: No focal deficit present.     Mental Status: He is alert and oriented to person, place, and time.     Deep Tendon Reflexes: Reflexes are normal and symmetric.  Psychiatric:        Attention and Perception: Attention normal.        Mood and Affect: Mood normal.        Speech: Speech normal.        Behavior: Behavior normal.        Thought Content: Thought content normal.        Judgment: Judgment normal.     Wt Readings from Last 3 Encounters:  12/07/19 170 lb (77.1 kg)  06/11/19 177 lb 6.4 oz (80.5 kg)  03/06/19 170 lb (77.1 kg)    BP 118/78   Pulse 79   Temp 98 F  (36.7 C) (Oral)   Ht 5\' 11"  (1.803 m)   Wt 170 lb (77.1 kg)   SpO2 97%   BMI 23.71 kg/m   Assessment and Plan: 1. Annual physical exam Normal exam Continue exercise, healthy diet - CBC with Differential/Platelet - POCT urinalysis dipstick  2. Prostate cancer screening DRE deferred - PSA  3. Hyperlipidemia, mild Controlled with diet and exercise - Comprehensive metabolic panel - Lipid panel  4. Dermatitis  Continue moisturizers; consider referral to another Dermatologist   Partially dictated using Editor, commissioning. Any errors are unintentional.  Halina Maidens, MD [facility]  12/07/2019

## 2019-12-08 LAB — COMPREHENSIVE METABOLIC PANEL
ALT: 17 IU/L (ref 0–44)
AST: 24 IU/L (ref 0–40)
Albumin/Globulin Ratio: 2.2 (ref 1.2–2.2)
Albumin: 4.8 g/dL (ref 3.8–4.9)
Alkaline Phosphatase: 67 IU/L (ref 39–117)
BUN/Creatinine Ratio: 18 (ref 9–20)
BUN: 17 mg/dL (ref 6–24)
Bilirubin Total: 0.7 mg/dL (ref 0.0–1.2)
CO2: 25 mmol/L (ref 20–29)
Calcium: 9.4 mg/dL (ref 8.7–10.2)
Chloride: 100 mmol/L (ref 96–106)
Creatinine, Ser: 0.94 mg/dL (ref 0.76–1.27)
GFR calc Af Amer: 103 mL/min/{1.73_m2} (ref >59)
GFR calc non Af Amer: 89 mL/min/{1.73_m2} (ref >59)
Globulin, Total: 2.2 g/dL (ref 1.5–4.5)
Glucose: 84 mg/dL (ref 65–99)
Potassium: 4.5 mmol/L (ref 3.5–5.2)
Sodium: 139 mmol/L (ref 134–144)
Total Protein: 7 g/dL (ref 6.0–8.5)

## 2019-12-08 LAB — CBC WITH DIFFERENTIAL/PLATELET
Basophils Absolute: 0 10*3/uL (ref 0.0–0.2)
Basos: 1 %
EOS (ABSOLUTE): 0.2 10*3/uL (ref 0.0–0.4)
Eos: 3 %
Hematocrit: 44 % (ref 37.5–51.0)
Hemoglobin: 15.4 g/dL (ref 13.0–17.7)
Immature Grans (Abs): 0 10*3/uL (ref 0.0–0.1)
Immature Granulocytes: 0 %
Lymphocytes Absolute: 1.6 10*3/uL (ref 0.7–3.1)
Lymphs: 28 %
MCH: 34.9 pg — ABNORMAL HIGH (ref 26.6–33.0)
MCHC: 35 g/dL (ref 31.5–35.7)
MCV: 100 fL — ABNORMAL HIGH (ref 79–97)
Monocytes Absolute: 0.6 10*3/uL (ref 0.1–0.9)
Monocytes: 11 %
Neutrophils Absolute: 3.4 10*3/uL (ref 1.4–7.0)
Neutrophils: 57 %
Platelets: 246 10*3/uL (ref 150–450)
RBC: 4.41 x10E6/uL (ref 4.14–5.80)
RDW: 12.6 % (ref 11.6–15.4)
WBC: 5.8 10*3/uL (ref 3.4–10.8)

## 2019-12-08 LAB — PSA: Prostate Specific Ag, Serum: 0.9 ng/mL (ref 0.0–4.0)

## 2019-12-08 LAB — LIPID PANEL
Chol/HDL Ratio: 3 ratio (ref 0.0–5.0)
Cholesterol, Total: 182 mg/dL (ref 100–199)
HDL: 61 mg/dL (ref >39)
LDL Chol Calc (NIH): 112 mg/dL — ABNORMAL HIGH (ref 0–99)
Triglycerides: 46 mg/dL (ref 0–149)
VLDL Cholesterol Cal: 9 mg/dL (ref 5–40)

## 2019-12-31 DIAGNOSIS — H5213 Myopia, bilateral: Secondary | ICD-10-CM | POA: Diagnosis not present

## 2020-06-30 ENCOUNTER — Other Ambulatory Visit: Payer: Self-pay | Admitting: Internal Medicine

## 2020-06-30 DIAGNOSIS — N529 Male erectile dysfunction, unspecified: Secondary | ICD-10-CM

## 2020-06-30 MED ORDER — SILDENAFIL CITRATE 20 MG PO TABS: 20.0000 mg | ORAL_TABLET | Freq: Every day | ORAL | 1 refills | Status: DC | PRN

## 2020-06-30 NOTE — Telephone Encounter (Signed)
Medication Refill - Medication: sildenafil  Has the patient contacted their pharmacy? Yes.   (Agent: If no, request that the patient contact the pharmacy for the refill.) (Agent: If yes, when and what did the pharmacy advise?)  Preferred Pharmacy (with phone number or street name):  [facility], [address]  [place] [address]  Phone: [phone] Fax: [phone]  Hours: Not open 24 hours     Agent: Please be advised that RX refills may take up to 3 business days. We ask that you follow-up with your pharmacy.

## 2020-08-01 DIAGNOSIS — L570 Actinic keratosis: Secondary | ICD-10-CM | POA: Diagnosis not present

## 2020-08-01 DIAGNOSIS — Z872 Personal history of diseases of the skin and subcutaneous tissue: Secondary | ICD-10-CM | POA: Diagnosis not present

## 2020-08-01 DIAGNOSIS — D485 Neoplasm of uncertain behavior of skin: Secondary | ICD-10-CM | POA: Diagnosis not present

## 2020-08-01 DIAGNOSIS — L578 Other skin changes due to chronic exposure to nonionizing radiation: Secondary | ICD-10-CM | POA: Diagnosis not present

## 2020-08-01 DIAGNOSIS — L218 Other seborrheic dermatitis: Secondary | ICD-10-CM | POA: Diagnosis not present

## 2020-09-12 DIAGNOSIS — L570 Actinic keratosis: Secondary | ICD-10-CM | POA: Diagnosis not present

## 2020-12-08 ENCOUNTER — Ambulatory Visit (INDEPENDENT_AMBULATORY_CARE_PROVIDER_SITE_OTHER): Payer: 59 | Admitting: Internal Medicine

## 2020-12-08 ENCOUNTER — Encounter: Payer: Self-pay | Admitting: Internal Medicine

## 2020-12-08 ENCOUNTER — Other Ambulatory Visit: Payer: Self-pay

## 2020-12-08 VITALS — BP 106/72 | HR 98 | Temp 97.8°F | Ht 71.0 in | Wt 160.0 lb

## 2020-12-08 DIAGNOSIS — R634 Abnormal weight loss: Secondary | ICD-10-CM

## 2020-12-08 DIAGNOSIS — Z125 Encounter for screening for malignant neoplasm of prostate: Secondary | ICD-10-CM | POA: Diagnosis not present

## 2020-12-08 DIAGNOSIS — S99921A Unspecified injury of right foot, initial encounter: Secondary | ICD-10-CM

## 2020-12-08 DIAGNOSIS — Z0000 Encounter for general adult medical examination without abnormal findings: Secondary | ICD-10-CM

## 2020-12-08 DIAGNOSIS — R1011 Right upper quadrant pain: Secondary | ICD-10-CM

## 2020-12-08 NOTE — Progress Notes (Signed)
Date:  12/08/2020   Name:  Hunter Morgan   DOB:  [birth year]   MRN:  [id number]   Chief Complaint: Annual Exam  Hunter Morgan is a 60 y.o. male who presents today for his Complete Annual Exam. He feels well. He reports exercising 12-15 miles walking on weekends. He reports he is sleeping fairly well.  He is now working for [employer].  Colonoscopy: 05/2012  Immunization History  Administered Date(s) Administered  . Influenza Split 07/14/2020  . Influenza-Unspecified 07/16/2015, 07/26/2019  . Moderna Sars-Covid-2 Vaccination 10/23/2019, 11/20/2019  . PFIZER(Purple Top)SARS-COV-2 Vaccination 09/21/2020  . Tdap 11/26/2016    Abdominal Pain This is a recurrent problem. The problem occurs intermittently. The problem has been unchanged. The pain is located in the RUQ. The pain is mild. The quality of the pain is a sensation of fullness. The abdominal pain does not radiate. Associated symptoms include arthralgias (left ring finger, right middle toe). Pertinent negatives include no constipation, diarrhea, dysuria, frequency, headaches, hematuria or myalgias. Nothing (but often occurs at night.) aggravates the pain.    Lab Results  Component Value Date   CREATININE 0.94 12/07/2019   BUN 17 12/07/2019   NA 139 12/07/2019   K 4.5 12/07/2019   CL 100 12/07/2019   CO2 25 12/07/2019   Lab Results  Component Value Date   CHOL 182 12/07/2019   HDL 61 12/07/2019   LDLCALC 112 (H) 12/07/2019   TRIG 46 12/07/2019   CHOLHDL 3.0 12/07/2019   Lab Results  Component Value Date   TSH 1.60 06/24/2014   No results found for: HGBA1C Lab Results  Component Value Date   WBC 5.8 12/07/2019   HGB 15.4 12/07/2019   HCT 44.0 12/07/2019   MCV 100 (H) 12/07/2019   PLT 246 12/07/2019   Lab Results  Component Value Date   ALT 17 12/07/2019   AST 24 12/07/2019   ALKPHOS 67 12/07/2019   BILITOT 0.7 12/07/2019     Review of Systems  Constitutional: Positive for unexpected weight change  (about 10 lb in the past year). Negative for appetite change, chills, diaphoresis and fatigue.  HENT: Negative for hearing loss, tinnitus, trouble swallowing and voice change.   Eyes: Negative for visual disturbance.  Respiratory: Negative for choking, shortness of breath and wheezing.   Cardiovascular: Negative for chest pain, palpitations and leg swelling.  Gastrointestinal: Positive for abdominal pain. Negative for blood in stool, constipation and diarrhea.  Genitourinary: Negative for difficulty urinating, dysuria, frequency and hematuria.  Musculoskeletal: Positive for arthralgias (left ring finger, right middle toe). Negative for back pain and myalgias.  Skin: Negative for color change and rash.  Neurological: Negative for dizziness, focal weakness, syncope and headaches.  Hematological: Negative for adenopathy.  Psychiatric/Behavioral: Negative for dysphoric mood and sleep disturbance.    Patient Active Problem List   Diagnosis Date Noted  . Erectile dysfunction 06/11/2019  . Environmental and seasonal allergies 09/20/2015  . H/O motion sickness 09/20/2015  . Hyperlipidemia, mild 09/20/2015    No Known Allergies  Past Surgical History:  Procedure Laterality Date  . COLONOSCOPY  05/2012   normal  . GANGLION CYST EXCISION Left    wrist  . KNEE SURGERY Left 2005  . VASECTOMY  2001    Social History   Tobacco Use  . Smoking status: Never Smoker  . Smokeless tobacco: Never Used  Substance Use Topics  . Alcohol use: Yes    Alcohol/week: 4.0 standard drinks    Types: 4  Standard drinks or equivalent per week  . Drug use: No     Medication list has been reviewed and updated.  Current Meds  Medication Sig  . hydrocortisone 2.5 % cream   . ketoconazole (NIZORAL) 2 % cream Apply 1 application topically daily.  . sildenafil (REVATIO) 20 MG tablet Take 1 tablet (20 mg total) by mouth daily as needed.    PHQ 2/9 Scores 12/08/2020 12/07/2019 06/11/2019 12/04/2018  PHQ - 2  Score 0 0 0 0  PHQ- 9 Score 0 0 - -    GAD 7 : Generalized Anxiety Score 12/08/2020  Nervous, Anxious, on Edge 0  Control/stop worrying 0  Worry too much - different things 0  Trouble relaxing 0  Restless 0  Easily annoyed or irritable 0  Afraid - awful might happen 0  Total GAD 7 Score 0    BP Readings from Last 3 Encounters:  12/08/20 106/72  12/07/19 118/78  06/11/19 124/80    Physical Exam Vitals and nursing note reviewed.  Constitutional:      Appearance: Normal appearance. He is well-developed.  HENT:     Head: Normocephalic.     Right Ear: Tympanic membrane, ear canal and external ear normal.     Left Ear: Tympanic membrane, ear canal and external ear normal.     Nose: Nose normal.  Eyes:     Conjunctiva/sclera: Conjunctivae normal.     Pupils: Pupils are equal, round, and reactive to light.  Neck:     Thyroid: No thyromegaly.     Vascular: No carotid bruit.  Cardiovascular:     Rate and Rhythm: Normal rate and regular rhythm.     Pulses: Normal pulses.     Heart sounds: Normal heart sounds.  Pulmonary:     Effort: Pulmonary effort is normal.     Breath sounds: Normal breath sounds. No wheezing.  Chest:  Breasts:     Right: No mass.     Left: No mass.    Abdominal:     General: Bowel sounds are normal. There is no distension.     Palpations: Abdomen is soft. There is no mass.     Tenderness: There is no abdominal tenderness. There is no guarding or rebound.  Musculoskeletal:        General: Normal range of motion.     Cervical back: Normal range of motion and neck supple.     Right lower leg: No edema.     Left lower leg: No edema.     Comments: Right middle toe slightly swollen, not red or tender but does not flex well  Lymphadenopathy:     Cervical: No cervical adenopathy.  Skin:    General: Skin is warm and dry.     Capillary Refill: Capillary refill takes less than 2 seconds.  Neurological:     General: No focal deficit present.     Mental  Status: He is alert and oriented to person, place, and time.     Deep Tendon Reflexes: Reflexes are normal and symmetric.  Psychiatric:        Attention and Perception: Attention normal.        Mood and Affect: Mood normal.        Behavior: Behavior normal.     Wt Readings from Last 3 Encounters:  12/08/20 160 lb (72.6 kg)  12/07/19 170 lb (77.1 kg)  06/11/19 177 lb 6.4 oz (80.5 kg)    BP 106/72   Pulse 98  Temp 97.8 F (36.6 C) (Oral)   Ht 5\' 11"  (1.803 m)   Wt 160 lb (72.6 kg)   SpO2 100%   BMI 22.32 kg/m   Assessment and Plan: 1. Annual physical exam Normal exam CRC screening and immunizations are up to date Continue healthy diet and regular exercise - Lipid panel  2. Prostate cancer screening DRE deferred - PSA  3. RUQ abdominal pain Could be mild gall bladder symptoms - pt to monitor for worsening, esp in relation to food - CBC with Differential/Platelet - Comprehensive metabolic panel  4. Weight loss, unintentional May be due to change in diet to low fat for wife's benefit but will check TSH  Pt to monitor at home - TSH  5. Injury of toe on right foot, initial encounter Caused by a splitting wedge landing on his toe some time ago Suspect healed fracture - no intervention is indicated   Partially dictated using Editor, commissioning. Any errors are unintentional.  Halina Maidens, MD [facility]  12/08/2020

## 2020-12-09 LAB — LIPID PANEL
Chol/HDL Ratio: 2.7 ratio (ref 0.0–5.0)
Cholesterol, Total: 169 mg/dL (ref 100–199)
HDL: 63 mg/dL (ref >39)
LDL Chol Calc (NIH): 97 mg/dL (ref 0–99)
Triglycerides: 40 mg/dL (ref 0–149)
VLDL Cholesterol Cal: 9 mg/dL (ref 5–40)

## 2020-12-09 LAB — CBC WITH DIFFERENTIAL/PLATELET
Basophils Absolute: 0 10*3/uL (ref 0.0–0.2)
Basos: 1 %
EOS (ABSOLUTE): 0.2 10*3/uL (ref 0.0–0.4)
Eos: 5 %
Hematocrit: 42.5 % (ref 37.5–51.0)
Hemoglobin: 14.4 g/dL (ref 13.0–17.7)
Immature Grans (Abs): 0 10*3/uL (ref 0.0–0.1)
Immature Granulocytes: 0 %
Lymphocytes Absolute: 1.2 10*3/uL (ref 0.7–3.1)
Lymphs: 25 %
MCH: 34.2 pg — ABNORMAL HIGH (ref 26.6–33.0)
MCHC: 33.9 g/dL (ref 31.5–35.7)
MCV: 101 fL — ABNORMAL HIGH (ref 79–97)
Monocytes Absolute: 0.5 10*3/uL (ref 0.1–0.9)
Monocytes: 11 %
Neutrophils Absolute: 2.9 10*3/uL (ref 1.4–7.0)
Neutrophils: 58 %
Platelets: 240 10*3/uL (ref 150–450)
RBC: 4.21 x10E6/uL (ref 4.14–5.80)
RDW: 12.4 % (ref 11.6–15.4)
WBC: 4.9 10*3/uL (ref 3.4–10.8)

## 2020-12-09 LAB — COMPREHENSIVE METABOLIC PANEL
ALT: 16 IU/L (ref 0–44)
AST: 23 IU/L (ref 0–40)
Albumin/Globulin Ratio: 2.1 (ref 1.2–2.2)
Albumin: 4.6 g/dL (ref 3.8–4.9)
Alkaline Phosphatase: 61 IU/L (ref 44–121)
BUN/Creatinine Ratio: 19 (ref 9–20)
BUN: 16 mg/dL (ref 6–24)
Bilirubin Total: 0.4 mg/dL (ref 0.0–1.2)
CO2: 23 mmol/L (ref 20–29)
Calcium: 9.7 mg/dL (ref 8.7–10.2)
Chloride: 102 mmol/L (ref 96–106)
Creatinine, Ser: 0.84 mg/dL (ref 0.76–1.27)
GFR calc Af Amer: 111 mL/min/{1.73_m2} (ref >59)
GFR calc non Af Amer: 96 mL/min/{1.73_m2} (ref >59)
Globulin, Total: 2.2 g/dL (ref 1.5–4.5)
Glucose: 96 mg/dL (ref 65–99)
Potassium: 4.8 mmol/L (ref 3.5–5.2)
Sodium: 140 mmol/L (ref 134–144)
Total Protein: 6.8 g/dL (ref 6.0–8.5)

## 2020-12-09 LAB — TSH: TSH: 1.17 u[IU]/mL (ref 0.450–4.500)

## 2020-12-09 LAB — PSA: Prostate Specific Ag, Serum: 0.8 ng/mL (ref 0.0–4.0)

## 2021-01-02 DIAGNOSIS — H5213 Myopia, bilateral: Secondary | ICD-10-CM | POA: Diagnosis not present

## 2021-08-30 DIAGNOSIS — L218 Other seborrheic dermatitis: Secondary | ICD-10-CM | POA: Diagnosis not present

## 2021-08-30 DIAGNOSIS — L570 Actinic keratosis: Secondary | ICD-10-CM | POA: Diagnosis not present

## 2021-08-30 DIAGNOSIS — D225 Melanocytic nevi of trunk: Secondary | ICD-10-CM | POA: Diagnosis not present

## 2021-08-30 DIAGNOSIS — D485 Neoplasm of uncertain behavior of skin: Secondary | ICD-10-CM | POA: Diagnosis not present

## 2021-08-30 DIAGNOSIS — A630 Anogenital (venereal) warts: Secondary | ICD-10-CM | POA: Diagnosis not present

## 2021-08-30 DIAGNOSIS — L578 Other skin changes due to chronic exposure to nonionizing radiation: Secondary | ICD-10-CM | POA: Diagnosis not present

## 2021-11-09 DIAGNOSIS — A630 Anogenital (venereal) warts: Secondary | ICD-10-CM | POA: Diagnosis not present

## 2021-12-11 ENCOUNTER — Encounter: Payer: Self-pay | Admitting: Internal Medicine

## 2021-12-14 ENCOUNTER — Other Ambulatory Visit: Payer: Self-pay

## 2021-12-14 ENCOUNTER — Encounter: Payer: Self-pay | Admitting: Internal Medicine

## 2021-12-14 ENCOUNTER — Ambulatory Visit (INDEPENDENT_AMBULATORY_CARE_PROVIDER_SITE_OTHER): Payer: BC Managed Care – PPO | Admitting: Internal Medicine

## 2021-12-14 VITALS — BP 110/76 | HR 78 | Ht 71.0 in | Wt 165.0 lb

## 2021-12-14 DIAGNOSIS — Z1211 Encounter for screening for malignant neoplasm of colon: Secondary | ICD-10-CM | POA: Diagnosis not present

## 2021-12-14 DIAGNOSIS — N529 Male erectile dysfunction, unspecified: Secondary | ICD-10-CM

## 2021-12-14 DIAGNOSIS — Z125 Encounter for screening for malignant neoplasm of prostate: Secondary | ICD-10-CM

## 2021-12-14 DIAGNOSIS — Z0000 Encounter for general adult medical examination without abnormal findings: Secondary | ICD-10-CM | POA: Diagnosis not present

## 2021-12-14 DIAGNOSIS — E785 Hyperlipidemia, unspecified: Secondary | ICD-10-CM | POA: Diagnosis not present

## 2021-12-14 DIAGNOSIS — R1011 Right upper quadrant pain: Secondary | ICD-10-CM

## 2021-12-14 DIAGNOSIS — Z23 Encounter for immunization: Secondary | ICD-10-CM

## 2021-12-14 DIAGNOSIS — Z8619 Personal history of other infectious and parasitic diseases: Secondary | ICD-10-CM

## 2021-12-14 MED ORDER — SILDENAFIL CITRATE 20 MG PO TABS: 20.0000 mg | ORAL_TABLET | Freq: Every day | ORAL | 1 refills | Status: DC | PRN

## 2021-12-14 NOTE — Progress Notes (Signed)
? ? ?Date:  12/14/2021  ? ?Name:  Hunter Morgan   DOB:  June 19, 1961   MRN:  [id number] ? ? ?Chief Complaint: Annual Exam ?Hunter Morgan is a 61 y.o. male who presents today for his Complete Annual Exam. He feels well. He reports exercising at work. He reports he is sleeping well. He started working for [employer] last year. ? ?Colonoscopy: 05/2012 normal ? ?Immunization History  ?Administered Date(s) Administered  ? Influenza Split 07/14/2020  ? Influenza-Unspecified 07/16/2015, 07/26/2019, 07/15/2021  ? Moderna Sars-Covid-2 Vaccination 10/23/2019, 11/20/2019  ? PFIZER(Purple Top)SARS-COV-2 Vaccination 09/21/2020  ? Tdap 11/26/2016  ? ? ?Lab Results  ?Component Value Date  ? PSA1 0.8 12/08/2020  ? PSA1 0.9 12/07/2019  ? PSA1 0.9 11/29/2017  ? PSA 0.9 06/24/2014  ? ? ?Abdominal Pain ?This is a recurrent problem. Episode onset: 6-12 mo ago. The problem occurs intermittently. The problem has been waxing and waning. The pain is located in the RUQ. The pain is mild. The quality of the pain is cramping. The abdominal pain does not radiate. Pertinent negatives include no arthralgias, constipation, diarrhea, dysuria, frequency, headaches, myalgias, nausea or vomiting. The pain is aggravated by certain positions (lying on left side). The pain is relieved by Nothing.  ?HPV - recently seen by Dermatology and diagnosed with genital warts.  They were treated and have not recurred.  He feels well.  His sexually active with one partner - his wife.  He has not discussed the dx with her.  ? ?Lab Results  ?Component Value Date  ? NA 140 12/08/2020  ? K 4.8 12/08/2020  ? CO2 23 12/08/2020  ? GLUCOSE 96 12/08/2020  ? BUN 16 12/08/2020  ? CREATININE 0.84 12/08/2020  ? CALCIUM 9.7 12/08/2020  ? GFRNONAA 96 12/08/2020  ? ?Lab Results  ?Component Value Date  ? CHOL 169 12/08/2020  ? HDL 63 12/08/2020  ? [place] 97 12/08/2020  ? TRIG 40 12/08/2020  ? CHOLHDL 2.7 12/08/2020  ? ?Lab Results  ?Component Value Date  ? TSH 1.170 12/08/2020  ? ?No  results found for: HGBA1C ?Lab Results  ?Component Value Date  ? WBC 4.9 12/08/2020  ? HGB 14.4 12/08/2020  ? HCT 42.5 12/08/2020  ? MCV 101 (H) 12/08/2020  ? PLT 240 12/08/2020  ? ?Lab Results  ?Component Value Date  ? ALT 16 12/08/2020  ? AST 23 12/08/2020  ? ALKPHOS 61 12/08/2020  ? BILITOT 0.4 12/08/2020  ? ?No results found for: 25OHVITD2, [place], VD25OH  ? ?Review of Systems  ?Constitutional:  Negative for appetite change, chills, diaphoresis, fatigue and unexpected weight change.  ?HENT:  Negative for hearing loss, tinnitus, trouble swallowing and voice change.   ?Eyes:  Negative for visual disturbance.  ?Respiratory:  Negative for choking, shortness of breath and wheezing.   ?Cardiovascular:  Negative for chest pain, palpitations and leg swelling.  ?Gastrointestinal:  Positive for abdominal pain. Negative for abdominal distention, blood in stool, constipation, diarrhea, nausea and vomiting.  ?     No jaundice  ?Endocrine: Negative for polydipsia and polyuria.  ?Genitourinary:  Negative for difficulty urinating, dysuria and frequency.  ?Musculoskeletal:  Negative for arthralgias, back pain and myalgias.  ?Skin:  Negative for color change and rash.  ?Neurological:  Negative for dizziness, syncope and headaches.  ?Hematological:  Negative for adenopathy.  ?Psychiatric/Behavioral:  Negative for dysphoric mood and sleep disturbance. The patient is not nervous/anxious.   ? ?Patient Active Problem List  ? Diagnosis Date Noted  ? Erectile dysfunction  06/11/2019  ? Environmental and seasonal allergies 09/20/2015  ? H/O motion sickness 09/20/2015  ? Hyperlipidemia, mild 09/20/2015  ? ? ?No Known Allergies ? ?Past Surgical History:  ?Procedure Laterality Date  ? COLONOSCOPY  05/2012  ? normal  ? GANGLION CYST EXCISION Left   ? wrist  ? KNEE SURGERY Left 2005  ? VASECTOMY  2001  ? ? ?Social History  ? ?Tobacco Use  ? Smoking status: Never  ? Smokeless tobacco: Never  ?Substance Use Topics  ? Alcohol use: Yes  ?   Alcohol/week: 4.0 standard drinks  ?  Types: 4 Standard drinks or equivalent per week  ? Drug use: No  ? ? ? ?Medication list has been reviewed and updated. ? ?Current Meds  ?Medication Sig  ? hydrocortisone 2.5 % cream   ? ketoconazole (NIZORAL) 2 % cream Apply 1 application topically daily.  ? sildenafil (REVATIO) 20 MG tablet Take 1 tablet (20 mg total) by mouth daily as needed.  ? ? ?PHQ 2/9 Scores 12/14/2021 12/08/2020 12/07/2019 06/11/2019  ?PHQ - 2 Score 0 0 0 0  ?PHQ- 9 Score 2 0 0 -  ? ? ?GAD 7 : Generalized Anxiety Score 12/14/2021 12/08/2020  ?Nervous, Anxious, on Edge 0 0  ?Control/stop worrying 0 0  ?Worry too much - different things 0 0  ?Trouble relaxing 0 0  ?Restless 0 0  ?Easily annoyed or irritable 0 0  ?Afraid - awful might happen 0 0  ?Total GAD 7 Score 0 0  ? ? ?BP Readings from Last 3 Encounters:  ?12/14/21 110/76  ?12/08/20 106/72  ?12/07/19 118/78  ? ? ?Physical Exam ?Vitals and nursing note reviewed.  ?Constitutional:   ?   Appearance: Normal appearance. He is well-developed.  ?HENT:  ?   Head: Normocephalic.  ?   Right Ear: Tympanic membrane, ear canal and external ear normal.  ?   Left Ear: Tympanic membrane, ear canal and external ear normal.  ?   Nose: Nose normal.  ?Eyes:  ?   Conjunctiva/sclera: Conjunctivae normal.  ?   Pupils: Pupils are equal, round, and reactive to light.  ?Neck:  ?   Thyroid: No thyromegaly.  ?   Vascular: No carotid bruit.  ?Cardiovascular:  ?   Rate and Rhythm: Normal rate and regular rhythm.  ?   Pulses: Normal pulses.  ?   Heart sounds: Normal heart sounds.  ?Pulmonary:  ?   Effort: Pulmonary effort is normal.  ?   Breath sounds: Normal breath sounds. No wheezing.  ?Chest:  ?Breasts: ?   Right: No mass.  ?   Left: No mass.  ?Abdominal:  ?   General: Abdomen is flat. Bowel sounds are normal.  ?   Palpations: Abdomen is soft.  ?   Tenderness: There is no abdominal tenderness.  ?Musculoskeletal:     ?   General: Normal range of motion.  ?   Cervical back: Normal range  of motion and neck supple.  ?   Right lower leg: No edema.  ?   Left lower leg: No edema.  ?Lymphadenopathy:  ?   Cervical: No cervical adenopathy.  ?Skin: ?   General: Skin is warm and dry.  ?   Capillary Refill: Capillary refill takes less than 2 seconds.  ?Neurological:  ?   General: No focal deficit present.  ?   Mental Status: He is alert and oriented to person, place, and time.  ?   Deep Tendon Reflexes: Reflexes are normal and  symmetric.  ?Psychiatric:     ?   Attention and Perception: Attention normal.     ?   Mood and Affect: Mood normal.     ?   Thought Content: Thought content normal.  ? ? ?Wt Readings from Last 3 Encounters:  ?12/14/21 165 lb (74.8 kg)  ?12/08/20 160 lb (72.6 kg)  ?12/07/19 170 lb (77.1 kg)  ? ? ?BP 110/76   Pulse 78   Ht 5\' 11"  (1.803 m)   Wt 165 lb (74.8 kg)   SpO2 96%   BMI 23.01 kg/m?  ? ?Assessment and Plan: ?1. Annual physical exam ?Normal exam.  Continue regular exercise and healthy diet. ?Up to date on screenings and immunizations. ?- CBC with Differential/Platelet ?- Comprehensive metabolic panel ? ?2. Colon cancer screening ?Due for 10 yr. colonoscopy ?- Ambulatory referral to Gastroenterology ? ?3. Prostate cancer screening ?DRE deferred ?- PSA ? ?4. Hyperlipidemia, mild ?Mild elevation in the past - improved with diet changes ?- Lipid panel ? ?5. History of HPV infection ?Recently treated with no worrisome findings ?He should let his wife know so she can make sure that she has or will be tested at her next GYN exam. ? ?6. Need for shingles vaccine ?Return tomorrow for first Shingrix. ? ?7. Colicky RUQ abdominal pain ?Intermittent without any worrisome features. ?His father died from [facility] of unknown cause - likely Hep C. ?If labs are suggestive, will get [place]. ? ?8. Erectile dysfunction, unspecified erectile dysfunction type ?- sildenafil (REVATIO) 20 MG tablet; Take 1 tablet (20 mg total) by mouth daily as needed.  Dispense: 10 tablet; Refill: 1 ? ? ?Partially dictated  using Editor, commissioning. Any errors are unintentional. ? ?Halina Maidens, MD ?[facility] ?[facility] ? ?12/14/2021 ? ? ? ? ? ?

## 2021-12-15 ENCOUNTER — Ambulatory Visit (INDEPENDENT_AMBULATORY_CARE_PROVIDER_SITE_OTHER): Payer: BC Managed Care – PPO

## 2021-12-15 DIAGNOSIS — Z23 Encounter for immunization: Secondary | ICD-10-CM | POA: Diagnosis not present

## 2021-12-15 LAB — CBC WITH DIFFERENTIAL/PLATELET
Basophils Absolute: 0 10*3/uL (ref 0.0–0.2)
Basos: 0 %
EOS (ABSOLUTE): 0.1 10*3/uL (ref 0.0–0.4)
Eos: 1 %
Hematocrit: 40.9 % (ref 37.5–51.0)
Hemoglobin: 13.8 g/dL (ref 13.0–17.7)
Immature Grans (Abs): 0.1 10*3/uL (ref 0.0–0.1)
Immature Granulocytes: 1 %
Lymphocytes Absolute: 2.2 10*3/uL (ref 0.7–3.1)
Lymphs: 28 %
MCH: 33.3 pg — ABNORMAL HIGH (ref 26.6–33.0)
MCHC: 33.7 g/dL (ref 31.5–35.7)
MCV: 99 fL — ABNORMAL HIGH (ref 79–97)
Monocytes Absolute: 0.9 10*3/uL (ref 0.1–0.9)
Monocytes: 12 %
Neutrophils Absolute: 4.5 10*3/uL (ref 1.4–7.0)
Neutrophils: 58 %
Platelets: 305 10*3/uL (ref 150–450)
RBC: 4.15 x10E6/uL (ref 4.14–5.80)
RDW: 13 % (ref 11.6–15.4)
WBC: 7.7 10*3/uL (ref 3.4–10.8)

## 2021-12-15 LAB — COMPREHENSIVE METABOLIC PANEL
ALT: 21 IU/L (ref 0–44)
AST: 23 IU/L (ref 0–40)
Albumin/Globulin Ratio: 2.1 (ref 1.2–2.2)
Albumin: 4.8 g/dL (ref 3.8–4.9)
Alkaline Phosphatase: 66 IU/L (ref 44–121)
BUN/Creatinine Ratio: 18 (ref 10–24)
BUN: 15 mg/dL (ref 8–27)
Bilirubin Total: 0.9 mg/dL (ref 0.0–1.2)
CO2: 23 mmol/L (ref 20–29)
Calcium: 9.1 mg/dL (ref 8.6–10.2)
Chloride: 100 mmol/L (ref 96–106)
Creatinine, Ser: 0.84 mg/dL (ref 0.76–1.27)
Globulin, Total: 2.3 g/dL (ref 1.5–4.5)
Glucose: 75 mg/dL (ref 70–99)
Potassium: 4.8 mmol/L (ref 3.5–5.2)
Sodium: 138 mmol/L (ref 134–144)
Total Protein: 7.1 g/dL (ref 6.0–8.5)
eGFR: 100 mL/min/{1.73_m2} (ref >59)

## 2021-12-15 LAB — LIPID PANEL
Chol/HDL Ratio: 2.7 ratio (ref 0.0–5.0)
Cholesterol, Total: 168 mg/dL (ref 100–199)
HDL: 62 mg/dL (ref >39)
LDL Chol Calc (NIH): 96 mg/dL (ref 0–99)
Triglycerides: 48 mg/dL (ref 0–149)
VLDL Cholesterol Cal: 10 mg/dL (ref 5–40)

## 2021-12-15 LAB — PSA: Prostate Specific Ag, Serum: 1 ng/mL (ref 0.0–4.0)

## 2021-12-18 ENCOUNTER — Other Ambulatory Visit: Payer: Self-pay

## 2021-12-18 DIAGNOSIS — Z1211 Encounter for screening for malignant neoplasm of colon: Secondary | ICD-10-CM

## 2021-12-18 MED ORDER — PEG 3350-KCL-NA BICARB-NACL 420 G PO SOLR: 4000.0000 mL | Freq: Once | ORAL | 0 refills | Status: AC

## 2021-12-18 NOTE — Progress Notes (Signed)
Gastroenterology Pre-Procedure Review ? ?Request Date: 01/26/2022 ?Requesting Physician: Dr. Allen Norris ? ?PATIENT REVIEW QUESTIONS: The patient responded to the following health history questions as indicated:   ? ?1. Are you having any GI issues? no ?2. Do you have a personal history of Polyps? no ?3. Do you have a family history of Colon Cancer or Polyps? no ?4. Diabetes Mellitus? no ?5. Joint replacements in the past 12 months?no ?6. Major health problems in the past 3 months?no ?7. Any artificial heart valves, MVP, or defibrillator?no ?   ?MEDICATIONS & ALLERGIES:    ?Patient reports the following regarding taking any anticoagulation/antiplatelet therapy:   ?Plavix, Coumadin, Eliquis, Xarelto, Lovenox, Pradaxa, Brilinta, or Effient? no ?Aspirin? no ? ?Patient confirms/reports the following medications:  ?Current Outpatient Medications  ?Medication Sig Dispense Refill  ? hydrocortisone 2.5 % cream     ? ketoconazole (NIZORAL) 2 % cream Apply 1 application topically daily.    ? sildenafil (REVATIO) 20 MG tablet Take 1 tablet (20 mg total) by mouth daily as needed. 10 tablet 1  ? ?No current facility-administered medications for this visit.  ? ? ?Patient confirms/reports the following allergies:  ?No Known Allergies ? ?No orders of the defined types were placed in this encounter. ? ? ?AUTHORIZATION INFORMATION ?Primary Insurance: ?1D#: ?Group #: ? ?Secondary Insurance: ?1D#: ?Group #: ? ?SCHEDULE INFORMATION: ?Date: 01/26/2022 ?Time: ?Location:[facility] ? ?

## 2021-12-21 DIAGNOSIS — A630 Anogenital (venereal) warts: Secondary | ICD-10-CM | POA: Diagnosis not present

## 2021-12-21 DIAGNOSIS — L818 Other specified disorders of pigmentation: Secondary | ICD-10-CM | POA: Diagnosis not present

## 2021-12-26 ENCOUNTER — Telehealth: Payer: Self-pay | Admitting: Internal Medicine

## 2021-12-26 NOTE — Telephone Encounter (Signed)
BRichelle from [employer] called in about form for prior auth that was faxed over for sildenafil (REVATIO) 20 MG tablet . They are asking for this to be returned. Case id # is  [id number]. ?

## 2021-12-26 NOTE — Telephone Encounter (Signed)
We do not complete prior auths on E.D. medications. He will need to pay out of pocket for this, which I believe he has done this in the past. ?

## 2021-12-28 ENCOUNTER — Telehealth: Payer: Self-pay | Admitting: Internal Medicine

## 2021-12-28 NOTE — Telephone Encounter (Signed)
There is no callback number to call Guadalupe Maple back. But we do not do prior auths on sildenafil 20 mg. Patient will need to pay out of pocket for this medication. ?

## 2021-12-28 NOTE — Telephone Encounter (Signed)
Copied from [place] [phone]. Topic: General - Other ?>> Dec 28, 2021 10:33 AM Leward Quan A wrote: ?Reason for CRM: Matthias Hughs with [facility] called in about a prior authorization for sildenafil (REVATIO) 20 MG tablet was faxed on 12/20/21 say that there is a window of 72 hrs that this Rx will be put to the side but will be reopened as soon as form is received or can be done verbally by calling  Ph# [phone] Case ID# [id number] ?

## 2022-01-03 DIAGNOSIS — H524 Presbyopia: Secondary | ICD-10-CM | POA: Diagnosis not present

## 2022-01-22 ENCOUNTER — Encounter: Payer: Self-pay | Admitting: Gastroenterology

## 2022-01-26 ENCOUNTER — Encounter
Admission: RE | Disposition: A | Discharge status: Home / Self Care | Payer: Self-pay | Source: Home / Self Care | Attending: Gastroenterology

## 2022-01-26 ENCOUNTER — Ambulatory Visit: Payer: 59 | Admitting: Anesthesiology

## 2022-01-26 ENCOUNTER — Encounter: Payer: Self-pay | Admitting: Gastroenterology

## 2022-01-26 ENCOUNTER — Other Ambulatory Visit: Payer: Self-pay

## 2022-01-26 ENCOUNTER — Ambulatory Visit
Admission: RE | Admit: 2022-01-26 | Discharge: 2022-01-26 | Disposition: A | Discharge status: Home / Self Care | Payer: 59 | Attending: Gastroenterology | Admitting: Gastroenterology

## 2022-01-26 DIAGNOSIS — K648 Other hemorrhoids: Secondary | ICD-10-CM | POA: Diagnosis not present

## 2022-01-26 DIAGNOSIS — Z1211 Encounter for screening for malignant neoplasm of colon: Secondary | ICD-10-CM

## 2022-01-26 HISTORY — DX: Dizziness and giddiness: R42

## 2022-01-26 HISTORY — PX: COLONOSCOPY WITH PROPOFOL: SHX5780

## 2022-01-26 HISTORY — DX: Motion sickness, initial encounter: T75.3XXA

## 2022-01-26 SURGERY — COLONOSCOPY WITH PROPOFOL
Anesthesia: General | Site: Rectum

## 2022-01-26 MED ORDER — LACTATED RINGERS IV SOLN: INTRAVENOUS | Status: DC

## 2022-01-26 MED ORDER — PROPOFOL 10 MG/ML IV BOLUS
INTRAVENOUS | Status: DC | PRN
  Administered 2022-01-26 (×3): 40 mg via INTRAVENOUS
  Administered 2022-01-26: 150 mg via INTRAVENOUS

## 2022-01-26 MED ORDER — ACETAMINOPHEN 325 MG PO TABS: 325.0000 mg | ORAL_TABLET | Freq: Once | ORAL | Status: DC

## 2022-01-26 MED ORDER — SODIUM CHLORIDE 0.9 % IV SOLN: INTRAVENOUS | Status: DC

## 2022-01-26 MED ORDER — LIDOCAINE HCL (CARDIAC) PF 100 MG/5ML IV SOSY
PREFILLED_SYRINGE | INTRAVENOUS | Status: DC | PRN
  Administered 2022-01-26: 30 mg via INTRAVENOUS

## 2022-01-26 MED ORDER — ACETAMINOPHEN 160 MG/5ML PO SOLN: 325.0000 mg | Freq: Once | ORAL | Status: DC

## 2022-01-26 MED ORDER — STERILE WATER FOR IRRIGATION IR SOLN
Status: DC | PRN
  Administered 2022-01-26: 500 mL

## 2022-01-26 SURGICAL SUPPLY — 22 items

## 2022-01-26 NOTE — Transfer of Care (Signed)
Immediate Anesthesia Transfer of Care Note ? ?Patient: Hunter Morgan ? ?Procedure(s) Performed: COLONOSCOPY WITH PROPOFOL (Rectum) ? ?Patient Location: PACU ? ?Anesthesia Type: General ? ?Level of Consciousness: awake, alert  and patient cooperative ? ?Airway and Oxygen Therapy: Patient Spontanous Breathing and Patient connected to supplemental oxygen ? ?Post-op Assessment: Post-op Vital signs reviewed, Patient's Cardiovascular Status Stable, Respiratory Function Stable, Patent Airway and No signs of Nausea or vomiting ? ?Post-op Vital Signs: Reviewed and stable ? ?Complications: No notable events documented. ? ?

## 2022-01-26 NOTE — Anesthesia Procedure Notes (Signed)
Date/Time: 01/26/2022 9:03 AM ?Performed by: Cameron Ali, CRNA ?Pre-anesthesia Checklist: Patient identified, Emergency Drugs available, Suction available, Timeout performed and Patient being monitored ?Patient Re-evaluated:Patient Re-evaluated prior to induction ?Oxygen Delivery Method: Nasal cannula ?Placement Confirmation: positive ETCO2 ? ? ? ? ?

## 2022-01-26 NOTE — Anesthesia Preprocedure Evaluation (Signed)
Anesthesia Evaluation  ?Patient identified by MRN, date of birth, ID band ?Patient awake ? ? ? ?Reviewed: ?Allergy & Precautions, H&P , NPO status , Patient's Chart, lab work & pertinent test results ? ?Airway ?Mallampati: II ? ?TM Distance: >3 FB ?Neck ROM: full ? ? ? Dental ?no notable dental hx. ? ?  ?Pulmonary ? ?  ?Pulmonary exam normal ?breath sounds clear to auscultation ? ? ? ? ? ? Cardiovascular ?Normal cardiovascular exam ?Rhythm:regular Rate:Normal ? ? ?  ?Neuro/Psych ?  ? GI/Hepatic ?  ?Endo/Other  ? ? Renal/GU ?  ? ?  ?Musculoskeletal ? ? Abdominal ?  ?Peds ? Hematology ?  ?Anesthesia Other Findings ? ? Reproductive/Obstetrics ? ?  ? ? ? ? ? ? ? ? ? ? ? ? ? ?  ?  ? ? ? ? ? ? ? ? ?Anesthesia Physical ?Anesthesia Plan ? ?ASA: 1 ? ?Anesthesia Plan: General  ? ?Post-op Pain Management: Minimal or no pain anticipated  ? ?Induction: Intravenous ? ?PONV Risk Score and Plan: 2 and Treatment may vary due to age or medical condition, TIVA and Propofol infusion ? ?Airway Management Planned: Natural Airway ? ?Additional Equipment:  ? ?Intra-op Plan:  ? ?Post-operative Plan:  ? ?Informed Consent: I have reviewed the patients History and Physical, chart, labs and discussed the procedure including the risks, benefits and alternatives for the proposed anesthesia with the patient or authorized representative who has indicated his/her understanding and acceptance.  ? ? ? ?Dental Advisory Given ? ?Plan Discussed with: CRNA ? ?Anesthesia Plan Comments:   ? ? ? ? ? ? ?Anesthesia Quick Evaluation ? ?

## 2022-01-26 NOTE — Op Note (Signed)
[facility] ?Gastroenterology ?Patient Name: Hunter Morgan ?Procedure Date: 01/26/2022 8:57 AM ?MRN: [id number] ?Account #: [id number] ?Date of Birth: 1961-09-28 ?Admit Type: Outpatient ?Age: 61 ?Room: [facility] OR ROOM 01 ?Gender: Male ?Note Status: Finalized ?Instrument Name: [id number] ?Procedure:             Colonoscopy ?Indications:           Screening for colorectal malignant neoplasm ?Providers:             Lucilla Lame MD, MD ?Referring MD:          Halina Maidens, MD (Referring MD) ?Medicines:             Propofol per Anesthesia ?Complications:         No immediate complications. ?Procedure:             Pre-Anesthesia Assessment: ?                       - Prior to the procedure, a History and Physical was  ?                       performed, and patient medications and allergies were  ?                       reviewed. The patient's tolerance of previous  ?                       anesthesia was also reviewed. The risks and benefits  ?                       of the procedure and the sedation options and risks  ?                       were discussed with the patient. All questions were  ?                       answered, and informed consent was obtained. Prior  ?                       Anticoagulants: The patient has taken no previous  ?                       anticoagulant or antiplatelet agents. ASA Grade  ?                       Assessment: II - A patient with mild systemic disease.  ?                       After reviewing the risks and benefits, the patient  ?                       was deemed in satisfactory condition to undergo the  ?                       procedure. ?                       After obtaining informed consent, the colonoscope was  ?  passed under direct vision. Throughout the procedure,  ?                       the patient's blood pressure, pulse, and oxygen  ?                       saturations were monitored continuously. The  ?                       Colonoscope was  introduced through the anus and  ?                       advanced to the the cecum, identified by appendiceal  ?                       orifice and ileocecal valve. The colonoscopy was  ?                       performed without difficulty. The patient tolerated  ?                       the procedure well. The quality of the bowel  ?                       preparation was excellent. ?Findings: ?     The perianal and digital rectal examinations were normal. ?     Non-bleeding internal hemorrhoids were found during retroflexion. The  ?     hemorrhoids were Grade I (internal hemorrhoids that do not prolapse). ?Impression:            - Non-bleeding internal hemorrhoids. ?                       - No specimens collected. ?Recommendation:        - Discharge patient to home. ?                       - Resume previous diet. ?                       - Continue present medications. ?                       - Repeat colonoscopy in 10 years for screening  ?                       purposes. ?Procedure Code(s):     --- Professional --- ?                       [phone], Colonoscopy, flexible; diagnostic, including  ?                       collection of specimen(s) by brushing or washing, when  ?                       performed (separate procedure) ?Diagnosis Code(s):     --- Professional --- ?                       Z12.11, Encounter for screening for malignant neoplasm  ?  of colon ?CPT copyright 2019 American Medical Association. All rights reserved. ?The codes documented in this report are preliminary and upon coder review may  ?be revised to meet current compliance requirements. ?Lucilla Lame MD, MD ?01/26/2022 9:21:45 AM ?This report has been signed electronically. ?Number of Addenda: 0 ?Note Initiated On: 01/26/2022 8:57 AM ?Scope Withdrawal Time: 0 hours 9 minutes 43 seconds  ?Total Procedure Duration: 0 hours 14 minutes 6 seconds  ?Estimated Blood Loss:  Estimated blood loss: none. ?     [facility] ?

## 2022-01-26 NOTE — H&P (Signed)
? ?  Lucilla Lame, MD [facility] ?[address] ?[place], [place] [address] ?Phone: [phone] ?Fax : [phone] ? ?Primary Care Physician:  Glean Hess, MD ?Primary Gastroenterologist:  Dr. Allen Norris ? ?Pre-Procedure History & Physical: ?HPI:  Hunter Morgan is a 61 y.o. male is here for a screening colonoscopy.  ? ?Past Medical History:  ?Diagnosis Date  ? Motion sickness   ? boats  ? Vertigo   ? over 10 years  ? ? ?Past Surgical History:  ?Procedure Laterality Date  ? COLONOSCOPY  05/2012  ? normal  ? GANGLION CYST EXCISION Left   ? wrist  ? KNEE SURGERY Left 2005  ? VASECTOMY  2001  ? ? ?Prior to Admission medications   ?Medication Sig Start Date End Date Taking? Authorizing Provider  ?hydrocortisone 2.5 % cream  07/20/19   [provider]  ?ketoconazole (NIZORAL) 2 % cream Apply 1 application topically daily. 07/20/19   [provider]  ?sildenafil (REVATIO) 20 MG tablet Take 1 tablet (20 mg total) by mouth daily as needed. ?Patient not taking: Reported on 01/22/2022 12/14/21   Glean Hess, MD  ? ? ?Allergies as of 12/18/2021  ? (No Known Allergies)  ? ? ?Family History  ?Problem Relation Age of Onset  ? Atrial fibrillation Mother   ? Liver cancer Father   ? Cancer Father   ? ? ?Social History  ? ?Socioeconomic History  ? Marital status: Married  ?  Spouse name: Not on file  ? Number of children: Not on file  ? Years of education: Not on file  ? Highest education level: Not on file  ?Occupational History  ? Not on file  ?Tobacco Use  ? Smoking status: Never  ? Smokeless tobacco: Never  ?Vaping Use  ? Vaping Use: Never used  ?Substance and Sexual Activity  ? Alcohol use: Yes  ?  Alcohol/week: 7.0 standard drinks  ?  Types: 7 Standard drinks or equivalent per week  ? Drug use: No  ? Sexual activity: Not on file  ?Other Topics Concern  ? Not on file  ?Social History Narrative  ? Not on file  ? ?Social Determinants of Health  ? ?Financial Resource Strain: Not on file  ?Food Insecurity: Not  on file  ?Transportation Needs: Not on file  ?Physical Activity: Not on file  ?Stress: Not on file  ?Social Connections: Not on file  ?Intimate Partner Violence: Not on file  ? ? ?Review of Systems: ?See HPI, otherwise negative ROS ? ?Physical Exam: ?BP 110/74   Pulse (!) 56   Temp 98.1 ?F (36.7 ?C) (Temporal)   Ht '5\' 11"'$  (1.803 m)   Wt 73.5 kg   SpO2 97%   BMI 22.61 kg/m?  ?General:   Alert,  pleasant and cooperative in NAD ?Head:  Normocephalic and atraumatic. ?Neck:  Supple; no masses or thyromegaly. ?Lungs:  Clear throughout to auscultation.    ?Heart:  Regular rate and rhythm. ?Abdomen:  Soft, nontender and nondistended. Normal bowel sounds, without guarding, and without rebound.   ?Neurologic:  Alert and  oriented x4;  grossly normal neurologically. ? ?Impression/Plan: ?MARDELL Morgan is now here to undergo a screening colonoscopy. ? ?Risks, benefits, and alternatives regarding colonoscopy have been reviewed with the patient.  Questions have been answered.  All parties agreeable. ?

## 2022-01-26 NOTE — Anesthesia Postprocedure Evaluation (Signed)
Anesthesia Post Note ? ?Patient: Hunter Morgan ? ?Procedure(s) Performed: COLONOSCOPY WITH PROPOFOL (Rectum) ? ? ?  ?Patient location during evaluation: PACU ?Anesthesia Type: General ?Level of consciousness: awake and alert and oriented ?Pain management: satisfactory to patient ?Vital Signs Assessment: post-procedure vital signs reviewed and stable ?Respiratory status: spontaneous breathing, nonlabored ventilation and respiratory function stable ?Cardiovascular status: blood pressure returned to baseline and stable ?Postop Assessment: Adequate PO intake and No signs of nausea or vomiting ?Anesthetic complications: no ? ? ?No notable events documented. ? ?Raliegh Ip ? ? ? ? ? ?

## 2022-01-29 ENCOUNTER — Encounter: Payer: Self-pay | Admitting: Gastroenterology

## 2022-02-05 ENCOUNTER — Other Ambulatory Visit: Payer: Self-pay

## 2022-02-05 DIAGNOSIS — N529 Male erectile dysfunction, unspecified: Secondary | ICD-10-CM

## 2022-02-05 MED ORDER — SILDENAFIL CITRATE 20 MG PO TABS
20.0000 mg | ORAL_TABLET | Freq: Every day | ORAL | 1 refills | Status: DC | PRN
  Filled 2022-02-05: qty 10, 10d supply, fill #0
  Filled 2022-05-24: qty 10, 10d supply, fill #1

## 2022-02-08 ENCOUNTER — Other Ambulatory Visit: Payer: Self-pay

## 2022-03-30 ENCOUNTER — Ambulatory Visit (INDEPENDENT_AMBULATORY_CARE_PROVIDER_SITE_OTHER): Payer: 59

## 2022-03-30 DIAGNOSIS — Z23 Encounter for immunization: Secondary | ICD-10-CM | POA: Diagnosis not present

## 2022-05-24 ENCOUNTER — Other Ambulatory Visit: Payer: Self-pay | Admitting: Internal Medicine

## 2022-05-24 ENCOUNTER — Other Ambulatory Visit: Payer: Self-pay

## 2022-05-24 DIAGNOSIS — N529 Male erectile dysfunction, unspecified: Secondary | ICD-10-CM

## 2022-05-24 MED ORDER — SILDENAFIL CITRATE 20 MG PO TABS
20.0000 mg | ORAL_TABLET | Freq: Every day | ORAL | 1 refills | Status: DC | PRN
  Filled 2022-05-24: qty 10, 10d supply, fill #0
  Filled 2022-10-24: qty 10, 10d supply, fill #1

## 2022-05-24 NOTE — Telephone Encounter (Signed)
Medication Refill - Medication: sildenafil (REVATIO) 20 MG tablet  Has the patient contacted their pharmacy? No.   Preferred Pharmacy (with phone number or street name):  [facility]  [address] [place] [address]  Phone:  [phone]  Fax:  [phone]   Has the patient been seen for an appointment in the last year OR does the patient have an upcoming appointment? Yes.

## 2022-05-24 NOTE — Telephone Encounter (Signed)
Requested Prescriptions  Pending Prescriptions Disp Refills  . sildenafil (REVATIO) 20 MG tablet 10 tablet 1    Sig: Take 1 tablet (20 mg total) by mouth daily as needed.     Urology: Erectile Dysfunction Agents Passed - 05/24/2022 12:10 PM      Passed - AST in normal range and within 360 days    AST  Date Value Ref Range Status  12/14/2021 23 0 - 40 IU/L Final         Passed - ALT in normal range and within 360 days    ALT  Date Value Ref Range Status  12/14/2021 21 0 - 44 IU/L Final         Passed - Last BP in normal range    BP Readings from Last 1 Encounters:  01/26/22 95/70         Passed - Valid encounter within last 12 months    Recent Outpatient Visits          5 months ago Annual physical exam   [facility] Glean Hess, MD   1 year ago Annual physical exam   [facility] Glean Hess, MD   2 years ago Annual physical exam   [facility] Glean Hess, MD   2 years ago Erectile dysfunction, unspecified erectile dysfunction type   [facility] Glean Hess, MD   3 years ago Annual physical exam   [facility] Glean Hess, MD

## 2022-08-30 DIAGNOSIS — Z872 Personal history of diseases of the skin and subcutaneous tissue: Secondary | ICD-10-CM | POA: Diagnosis not present

## 2022-08-30 DIAGNOSIS — L720 Epidermal cyst: Secondary | ICD-10-CM | POA: Diagnosis not present

## 2022-08-30 DIAGNOSIS — L821 Other seborrheic keratosis: Secondary | ICD-10-CM | POA: Diagnosis not present

## 2022-08-30 DIAGNOSIS — L578 Other skin changes due to chronic exposure to nonionizing radiation: Secondary | ICD-10-CM | POA: Diagnosis not present

## 2022-08-30 DIAGNOSIS — L218 Other seborrheic dermatitis: Secondary | ICD-10-CM | POA: Diagnosis not present

## 2022-09-04 ENCOUNTER — Other Ambulatory Visit: Payer: Self-pay

## 2022-09-04 MED ORDER — KETOCONAZOLE 2 % EX CREA
1.0000 | TOPICAL_CREAM | Freq: Two times a day (BID) | CUTANEOUS | 1 refills | Status: DC
  Filled 2022-09-04 (×2): qty 60, 30d supply, fill #0
  Filled 2023-06-25: qty 60, 30d supply, fill #1

## 2022-09-04 MED ORDER — HYDROCORTISONE 2.5 % EX CREA
TOPICAL_CREAM | CUTANEOUS | 2 refills | Status: AC
  Filled 2022-09-04: qty 60, 30d supply, fill #0

## 2022-09-04 MED ORDER — KETOCONAZOLE 2 % EX SHAM
MEDICATED_SHAMPOO | CUTANEOUS | 11 refills | Status: DC
  Filled 2022-09-04 (×2): qty 120, 30d supply, fill #0
  Filled 2023-05-17: qty 120, 30d supply, fill #1

## 2022-10-24 ENCOUNTER — Other Ambulatory Visit: Payer: Self-pay

## 2022-12-25 DIAGNOSIS — D485 Neoplasm of uncertain behavior of skin: Secondary | ICD-10-CM | POA: Diagnosis not present

## 2023-01-07 DIAGNOSIS — H5213 Myopia, bilateral: Secondary | ICD-10-CM | POA: Diagnosis not present

## 2023-01-07 DIAGNOSIS — Q141 Congenital malformation of retina: Secondary | ICD-10-CM | POA: Diagnosis not present

## 2023-01-07 DIAGNOSIS — H52223 Regular astigmatism, bilateral: Secondary | ICD-10-CM | POA: Diagnosis not present

## 2023-01-07 DIAGNOSIS — H524 Presbyopia: Secondary | ICD-10-CM | POA: Diagnosis not present

## 2023-01-17 ENCOUNTER — Ambulatory Visit (INDEPENDENT_AMBULATORY_CARE_PROVIDER_SITE_OTHER): Payer: 59 | Admitting: Internal Medicine

## 2023-01-17 ENCOUNTER — Encounter: Payer: Self-pay | Admitting: Internal Medicine

## 2023-01-17 VITALS — BP 118/68 | HR 70 | Ht 71.0 in | Wt 165.0 lb

## 2023-01-17 DIAGNOSIS — Z125 Encounter for screening for malignant neoplasm of prostate: Secondary | ICD-10-CM

## 2023-01-17 DIAGNOSIS — E785 Hyperlipidemia, unspecified: Secondary | ICD-10-CM | POA: Diagnosis not present

## 2023-01-17 DIAGNOSIS — B351 Tinea unguium: Secondary | ICD-10-CM

## 2023-01-17 DIAGNOSIS — Z0000 Encounter for general adult medical examination without abnormal findings: Secondary | ICD-10-CM

## 2023-01-17 DIAGNOSIS — M533 Sacrococcygeal disorders, not elsewhere classified: Secondary | ICD-10-CM

## 2023-01-17 DIAGNOSIS — G5601 Carpal tunnel syndrome, right upper limb: Secondary | ICD-10-CM

## 2023-01-17 NOTE — Assessment & Plan Note (Signed)
Lab Results  Component Value Date   LDLCALC 96 12/14/2021  On diet control. The 10-year ASCVD risk score (Arnett DK, et al., 2019) is: 4.2%   Values used to calculate the score:     Age: 62 years     Sex: Male     Is Non-Hispanic African American: No     Diabetic: No     Tobacco smoker: No     Systolic Blood Pressure: 95 mmHg     Is BP treated: No     HDL Cholesterol: 62 mg/dL     Total Cholesterol: 168 mg/dL

## 2023-01-17 NOTE — Progress Notes (Signed)
Date:  01/17/2023   Name:  Hunter Morgan   DOB:  29-Dec-1960   MRN:  [id number]   Chief Complaint: Annual Exam Hunter Morgan is a 62 y.o. male who presents today for his Complete Annual Exam. He feels well. He reports exercising walk 20 miles a week and yard work. He reports he is sleeping well.   Colonoscopy: 01/2022 repeat 10 yr  Immunization History  Administered Date(s) Administered   Influenza Split 07/14/2020   Influenza-Unspecified 07/26/2019, 07/15/2021, 08/08/2022   Moderna Sars-Covid-2 Vaccination 10/23/2019, 11/20/2019   PFIZER(Purple Top)SARS-COV-2 Vaccination 09/21/2020, 02/14/2022   Tdap 11/26/2016   Zoster Recombinat (Shingrix) 12/15/2021, 03/30/2022   Health Maintenance Due  Topic Date Due   HIV Screening  Never done   COVID-19 Vaccine (5 - 2023-24 season) 06/15/2022    Lab Results  Component Value Date   PSA1 1.0 12/14/2021   PSA1 0.8 12/08/2020   PSA1 0.9 12/07/2019   PSA 0.9 06/24/2014     Back Pain This is a recurrent problem. The pain is present in the sacro-iliac. The quality of the pain is described as aching and burning. Associated symptoms include numbness. Pertinent negatives include no abdominal pain, chest pain, dysuria or headaches.  Wrist Pain  This is a new problem. The current episode started more than 1 month ago. The problem occurs intermittently (usually at night). Quality: tingling at night. Associated symptoms include numbness.    Lab Results  Component Value Date   NA 138 12/14/2021   K 4.8 12/14/2021   CO2 23 12/14/2021   GLUCOSE 75 12/14/2021   BUN 15 12/14/2021   CREATININE 0.84 12/14/2021   CALCIUM 9.1 12/14/2021   EGFR 100 12/14/2021   GFRNONAA 96 12/08/2020   Lab Results  Component Value Date   CHOL 168 12/14/2021   HDL 62 12/14/2021   LDLCALC 96 12/14/2021   TRIG 48 12/14/2021   CHOLHDL 2.7 12/14/2021   Lab Results  Component Value Date   TSH 1.170 12/08/2020   No results found for: "HGBA1C" Lab Results   Component Value Date   WBC 7.7 12/14/2021   HGB 13.8 12/14/2021   HCT 40.9 12/14/2021   MCV 99 (H) 12/14/2021   PLT 305 12/14/2021   Lab Results  Component Value Date   ALT 21 12/14/2021   AST 23 12/14/2021   ALKPHOS 66 12/14/2021   BILITOT 0.9 12/14/2021   No results found for: "25OHVITD2", "25OHVITD3", "VD25OH"   Review of Systems  Constitutional:  Negative for appetite change, chills, diaphoresis, fatigue and unexpected weight change.  HENT:  Negative for hearing loss, tinnitus, trouble swallowing and voice change.   Eyes:  Negative for visual disturbance.  Respiratory:  Negative for choking, shortness of breath and wheezing.   Cardiovascular:  Negative for chest pain, palpitations and leg swelling.  Gastrointestinal:  Negative for abdominal pain, blood in stool, constipation and diarrhea.  Genitourinary:  Negative for difficulty urinating, dysuria and frequency.  Musculoskeletal:  Positive for back pain. Negative for arthralgias and myalgias.  Skin:  Negative for color change and rash.       Toe nail thickening  Neurological:  Positive for numbness. Negative for dizziness, syncope and headaches. Seizures: in hands while sleeping. Hematological:  Negative for adenopathy.  Psychiatric/Behavioral:  Negative for dysphoric mood and sleep disturbance. The patient is not nervous/anxious.     Patient Active Problem List   Diagnosis Date Noted   Colon cancer screening    Erectile dysfunction 06/11/2019  Environmental and seasonal allergies 09/20/2015   H/O motion sickness 09/20/2015   Hyperlipidemia, mild 09/20/2015    No Known Allergies  Past Surgical History:  Procedure Laterality Date   COLONOSCOPY  05/2012   normal   COLONOSCOPY WITH PROPOFOL N/A 01/26/2022   Procedure: COLONOSCOPY WITH PROPOFOL;  Surgeon: Lucilla Lame, MD;  Location: [facility];  Service: Endoscopy;  Laterality: N/A;   GANGLION CYST EXCISION Left    wrist   KNEE SURGERY Left 2005    VASECTOMY  2001    Social History   Tobacco Use   Smoking status: Never   Smokeless tobacco: Never  Vaping Use   Vaping Use: Never used  Substance Use Topics   Alcohol use: Yes    Alcohol/week: 7.0 standard drinks of alcohol    Types: 7 Standard drinks or equivalent per week   Drug use: No     Medication list has been reviewed and updated.  Current Meds  Medication Sig   hydrocortisone 2.5 % cream Apply a thin layer to the affected areas twice daily. Decrease use as improved.   ketoconazole (NIZORAL) 2 % cream Apply 1 Application topically 2 (two) times daily.   ketoconazole (NIZORAL) 2 % shampoo Wash affected area two to three times a week . Leave on for 5-10 min before rinsing.   sildenafil (REVATIO) 20 MG tablet Take 1 tablet (20 mg total) by mouth daily as needed.       01/17/2023   11:01 AM 12/14/2021    3:05 PM 12/08/2020    8:14 AM  GAD 7 : Generalized Anxiety Score  Nervous, Anxious, on Edge 0 0 0  Control/stop worrying 0 0 0  Worry too much - different things 0 0 0  Trouble relaxing 0 0 0  Restless 0 0 0  Easily annoyed or irritable 0 0 0  Afraid - awful might happen 0 0 0  Total GAD 7 Score 0 0 0  Anxiety Difficulty Not difficult at all         01/17/2023   11:00 AM 12/14/2021    3:05 PM 12/08/2020    8:14 AM  Depression screen PHQ 2/9  Decreased Interest 0 0 0  Down, Depressed, Hopeless 0 0 0  PHQ - 2 Score 0 0 0  Altered sleeping 0 1 0  Tired, decreased energy 0 1 0  Change in appetite 0 0 0  Feeling bad or failure about yourself  0 0 0  Trouble concentrating 0 0 0  Moving slowly or fidgety/restless 0 0 0  Suicidal thoughts 0 0 0  PHQ-9 Score 0 2 0  Difficult doing work/chores Not difficult at all      BP Readings from Last 3 Encounters:  01/17/23 118/68  01/26/22 95/70  12/14/21 110/76    Physical Exam Vitals and nursing note reviewed.  Constitutional:      Appearance: Normal appearance. He is well-developed.  HENT:     Head:  Normocephalic.     Right Ear: Tympanic membrane, ear canal and external ear normal.     Left Ear: Tympanic membrane, ear canal and external ear normal.     Nose: Nose normal.  Eyes:     Conjunctiva/sclera: Conjunctivae normal.     Pupils: Pupils are equal, round, and reactive to light.  Neck:     Thyroid: No thyromegaly.     Vascular: No carotid bruit.  Cardiovascular:     Rate and Rhythm: Normal rate and regular rhythm.  Pulses: Normal pulses.     Heart sounds: Normal heart sounds. No murmur heard. Pulmonary:     Effort: Pulmonary effort is normal.     Breath sounds: Normal breath sounds. No wheezing.  Chest:  Breasts:    Right: No mass.     Left: No mass.  Abdominal:     General: Bowel sounds are normal.     Palpations: Abdomen is soft.     Tenderness: There is no abdominal tenderness.  Musculoskeletal:        General: Normal range of motion.     Right wrist: No tenderness. Normal pulse.     Left wrist: No tenderness. Normal pulse.     Cervical back: Normal range of motion and neck supple.     Lumbar back: Negative right straight leg raise test and negative left straight leg raise test.     Right lower leg: No edema.     Left lower leg: No edema.     Comments: Mild tenderness over left SI region. Positive Tinel's and Phalen's on right/ equivocal on left  Lymphadenopathy:     Cervical: No cervical adenopathy.  Skin:    General: Skin is warm and dry.     Capillary Refill: Capillary refill takes less than 2 seconds.  Neurological:     General: No focal deficit present.     Mental Status: He is alert and oriented to person, place, and time.     Deep Tendon Reflexes: Reflexes are normal and symmetric.  Psychiatric:        Attention and Perception: Attention normal.        Mood and Affect: Mood normal.        Thought Content: Thought content normal.     Wt Readings from Last 3 Encounters:  01/17/23 165 lb (74.8 kg)  01/26/22 162 lb 1.6 oz (73.5 kg)  12/14/21 165  lb (74.8 kg)    BP 118/68   Pulse 70   Ht 5\' 11"  (1.803 m)   Wt 165 lb (74.8 kg)   SpO2 98%   BMI 23.01 kg/m   Assessment and Plan:  Problem List Items Addressed This Visit       Other   Hyperlipidemia, mild    Lab Results  Component Value Date   LDLCALC 96 12/14/2021  On diet control. The 10-year ASCVD risk score (Arnett DK, et al., 2019) is: 4.2%   Values used to calculate the score:     Age: 30 years     Sex: Male     Is Non-Hispanic African American: No     Diabetic: No     Tobacco smoker: No     Systolic Blood Pressure: 95 mmHg     Is BP treated: No     HDL Cholesterol: 62 mg/dL     Total Cholesterol: 168 mg/dL       Relevant Orders   Lipid panel   Other Visit Diagnoses     Annual physical exam    -  Primary   Relevant Orders   CBC with Differential/Platelet   Comprehensive metabolic panel   Lipid panel   PSA   TSH   Prostate cancer screening       Relevant Orders   PSA   Carpal tunnel syndrome of right wrist       "cock-up" wrist splint to be worn during the night PRN   Relevant Orders   CBC with Differential/Platelet   TSH   Sacro-iliac pain  continue conservative care; add heat if needed   Fungal infection of nail       topical agents recommended - Vicks or Fungi-nail lacquer       No follow-ups on file.   Partially dictated using Rosalie, any errors are not intentional.  Glean Hess, MD [place], [place]

## 2023-01-17 NOTE — Patient Instructions (Addendum)
Cock up wrist splint to be worn at night.  Heat to lower back.  Vicks VapoRub or Fungi-nail lacquer nightly to affected toe nails.

## 2023-01-18 LAB — COMPREHENSIVE METABOLIC PANEL
ALT: 19 IU/L (ref 0–44)
AST: 33 IU/L (ref 0–40)
Albumin/Globulin Ratio: 2 (ref 1.2–2.2)
Albumin: 4.7 g/dL (ref 3.9–4.9)
Alkaline Phosphatase: 68 IU/L (ref 44–121)
BUN/Creatinine Ratio: 24 (ref 10–24)
BUN: 20 mg/dL (ref 8–27)
Bilirubin Total: 0.9 mg/dL (ref 0.0–1.2)
CO2: 22 mmol/L (ref 20–29)
Calcium: 9.2 mg/dL (ref 8.6–10.2)
Chloride: 101 mmol/L (ref 96–106)
Creatinine, Ser: 0.82 mg/dL (ref 0.76–1.27)
Globulin, Total: 2.3 g/dL (ref 1.5–4.5)
Glucose: 84 mg/dL (ref 70–99)
Potassium: 4.3 mmol/L (ref 3.5–5.2)
Sodium: 138 mmol/L (ref 134–144)
Total Protein: 7 g/dL (ref 6.0–8.5)
eGFR: 100 mL/min/{1.73_m2} (ref >59)

## 2023-01-18 LAB — PSA: Prostate Specific Ag, Serum: 0.8 ng/mL (ref 0.0–4.0)

## 2023-01-18 LAB — CBC WITH DIFFERENTIAL/PLATELET
Basophils Absolute: 0 10*3/uL (ref 0.0–0.2)
Basos: 1 %
EOS (ABSOLUTE): 0.1 10*3/uL (ref 0.0–0.4)
Eos: 1 %
Hematocrit: 42.5 % (ref 37.5–51.0)
Hemoglobin: 14.3 g/dL (ref 13.0–17.7)
Immature Grans (Abs): 0 10*3/uL (ref 0.0–0.1)
Immature Granulocytes: 0 %
Lymphocytes Absolute: 1.6 10*3/uL (ref 0.7–3.1)
Lymphs: 27 %
MCH: 33.7 pg — ABNORMAL HIGH (ref 26.6–33.0)
MCHC: 33.6 g/dL (ref 31.5–35.7)
MCV: 100 fL — ABNORMAL HIGH (ref 79–97)
Monocytes Absolute: 0.5 10*3/uL (ref 0.1–0.9)
Monocytes: 9 %
Neutrophils Absolute: 3.6 10*3/uL (ref 1.4–7.0)
Neutrophils: 62 %
Platelets: 240 10*3/uL (ref 150–450)
RBC: 4.24 x10E6/uL (ref 4.14–5.80)
RDW: 12.7 % (ref 11.6–15.4)
WBC: 5.8 10*3/uL (ref 3.4–10.8)

## 2023-01-18 LAB — LIPID PANEL
Chol/HDL Ratio: 2.5 ratio (ref 0.0–5.0)
Cholesterol, Total: 168 mg/dL (ref 100–199)
HDL: 67 mg/dL (ref >39)
LDL Chol Calc (NIH): 92 mg/dL (ref 0–99)
Triglycerides: 41 mg/dL (ref 0–149)
VLDL Cholesterol Cal: 9 mg/dL (ref 5–40)

## 2023-01-18 LAB — TSH: TSH: 1.28 u[IU]/mL (ref 0.450–4.500)

## 2023-03-12 ENCOUNTER — Other Ambulatory Visit: Payer: Self-pay | Admitting: Internal Medicine

## 2023-03-12 DIAGNOSIS — N529 Male erectile dysfunction, unspecified: Secondary | ICD-10-CM

## 2023-03-12 NOTE — Telephone Encounter (Signed)
Medication Refill - Medication: sildenafil (REVATIO) 20 MG tablet [[id number]]   Has the patient contacted their pharmacy? Yes.    Preferred Pharmacy (with phone number or street name):   [facility]  [address] [place] [address]  Phone: [phone] Fax: [phone]  Hours: M-F 7:30a-5:30p     Has the patient been seen for an appointment in the last year OR does the patient have an upcoming appointment? Yes.    Agent: Please be advised that RX refills may take up to 3 business days. We ask that you follow-up with your pharmacy.

## 2023-03-13 ENCOUNTER — Other Ambulatory Visit: Payer: Self-pay

## 2023-03-13 MED ORDER — SILDENAFIL CITRATE 20 MG PO TABS
20.0000 mg | ORAL_TABLET | Freq: Every day | ORAL | 1 refills | Status: DC | PRN
  Filled 2023-03-13: qty 10, 10d supply, fill #0
  Filled 2023-06-25: qty 10, 10d supply, fill #1

## 2023-03-13 NOTE — Telephone Encounter (Signed)
Requested Prescriptions  Pending Prescriptions Disp Refills   sildenafil (REVATIO) 20 MG tablet 10 tablet 1    Sig: Take 1 tablet (20 mg total) by mouth daily as needed.     Urology: Erectile Dysfunction Agents Passed - 03/12/2023  2:22 PM      Passed - AST in normal range and within 360 days    AST  Date Value Ref Range Status  01/17/2023 33 0 - 40 IU/L Final         Passed - ALT in normal range and within 360 days    ALT  Date Value Ref Range Status  01/17/2023 19 0 - 44 IU/L Final         Passed - Last BP in normal range    BP Readings from Last 1 Encounters:  01/17/23 118/68         Passed - Valid encounter within last 12 months    Recent Outpatient Visits           1 month ago Annual physical exam   [place] [facility] at [facility], Nyoka Cowden, MD   1 year ago Annual physical exam   [facility] at [facility], Nyoka Cowden, MD   2 years ago Annual physical exam   [facility] at [facility], Nyoka Cowden, MD   3 years ago Annual physical exam   [facility] at [facility], Nyoka Cowden, MD   3 years ago Erectile dysfunction, unspecified erectile dysfunction type   [facility] at [facility], Nyoka Cowden, MD       Future Appointments             In 10 months Judithann Graves Nyoka Cowden, MD [facility] Medicine at [facility], [facility]

## 2023-03-14 ENCOUNTER — Other Ambulatory Visit: Payer: Self-pay

## 2023-05-17 ENCOUNTER — Other Ambulatory Visit: Payer: Self-pay

## 2023-06-25 ENCOUNTER — Other Ambulatory Visit: Payer: Self-pay

## 2023-09-03 ENCOUNTER — Other Ambulatory Visit: Payer: Self-pay

## 2023-09-03 DIAGNOSIS — L570 Actinic keratosis: Secondary | ICD-10-CM | POA: Diagnosis not present

## 2023-09-03 DIAGNOSIS — L578 Other skin changes due to chronic exposure to nonionizing radiation: Secondary | ICD-10-CM | POA: Diagnosis not present

## 2023-09-03 DIAGNOSIS — Z872 Personal history of diseases of the skin and subcutaneous tissue: Secondary | ICD-10-CM | POA: Diagnosis not present

## 2023-09-03 DIAGNOSIS — L218 Other seborrheic dermatitis: Secondary | ICD-10-CM | POA: Diagnosis not present

## 2023-09-03 DIAGNOSIS — L80 Vitiligo: Secondary | ICD-10-CM | POA: Diagnosis not present

## 2023-09-03 MED ORDER — ZORYVE 0.3 % EX FOAM
1.0000 | Freq: Every day | CUTANEOUS | 1 refills | Status: AC
  Filled 2023-09-03: qty 60, 30d supply, fill #0
  Filled 2023-09-17: qty 60, 60d supply, fill #0
  Filled 2024-04-09 – 2024-04-14 (×2): qty 60, 60d supply, fill #1

## 2023-09-03 MED ORDER — OPZELURA 1.5 % EX CREA
1.0000 | TOPICAL_CREAM | Freq: Every day | CUTANEOUS | 2 refills | Status: DC
Start: 2023-09-03 — End: 2024-01-20
  Filled 2023-09-03: qty 60, 30d supply, fill #0
  Filled 2023-09-17: qty 60, 60d supply, fill #0

## 2023-09-04 ENCOUNTER — Other Ambulatory Visit: Payer: Self-pay

## 2023-09-04 MED ORDER — KETOCONAZOLE 2 % EX CREA
1.0000 | TOPICAL_CREAM | Freq: Every day | CUTANEOUS | 3 refills | Status: AC
  Filled 2023-09-04: qty 60, 60d supply, fill #0
  Filled 2023-12-06: qty 60, 60d supply, fill #1

## 2023-09-04 MED ORDER — HYDROCORTISONE 2.5 % EX CREA
1.0000 | TOPICAL_CREAM | Freq: Two times a day (BID) | CUTANEOUS | 0 refills | Status: DC
  Filled 2023-09-04: qty 60, 30d supply, fill #0

## 2023-09-05 ENCOUNTER — Other Ambulatory Visit: Payer: Self-pay

## 2023-09-17 ENCOUNTER — Other Ambulatory Visit: Payer: Self-pay

## 2023-12-02 ENCOUNTER — Other Ambulatory Visit: Payer: Self-pay

## 2023-12-02 ENCOUNTER — Other Ambulatory Visit: Payer: Self-pay | Admitting: Internal Medicine

## 2023-12-02 DIAGNOSIS — N529 Male erectile dysfunction, unspecified: Secondary | ICD-10-CM

## 2023-12-03 ENCOUNTER — Other Ambulatory Visit: Payer: Self-pay

## 2023-12-03 MED FILL — Sildenafil Citrate Tab 20 MG: ORAL | 10 days supply | Qty: 10 | Fill #0 | Status: AC

## 2023-12-03 NOTE — Telephone Encounter (Signed)
Requested Prescriptions  Pending Prescriptions Disp Refills   sildenafil (REVATIO) 20 MG tablet 10 tablet 0    Sig: Take 1 tablet (20 mg total) by mouth daily as needed.     Urology: Erectile Dysfunction Agents Passed - 12/03/2023  2:27 PM      Passed - AST in normal range and within 360 days    AST  Date Value Ref Range Status  01/17/2023 33 0 - 40 IU/L Final         Passed - ALT in normal range and within 360 days    ALT  Date Value Ref Range Status  01/17/2023 19 0 - 44 IU/L Final         Passed - Last BP in normal range    BP Readings from Last 1 Encounters:  01/17/23 118/68         Passed - Valid encounter within last 12 months    Recent Outpatient Visits           10 months ago Annual physical exam   [place] [facility] at [facility], Nyoka Cowden, MD   1 year ago Annual physical exam   [facility] at [facility], Nyoka Cowden, MD   2 years ago Annual physical exam   [facility] at [facility], Nyoka Cowden, MD   3 years ago Annual physical exam   [facility] & [facility] Medicine at [facility], Nyoka Cowden, MD   4 years ago Erectile dysfunction, unspecified erectile dysfunction type   [facility] at [facility], Nyoka Cowden, MD       Future Appointments             In 1 month Judithann Graves, Nyoka Cowden, MD [facility] & [facility] Medicine at [facility], [facility]

## 2023-12-04 ENCOUNTER — Other Ambulatory Visit: Payer: Self-pay

## 2023-12-06 ENCOUNTER — Other Ambulatory Visit: Payer: Self-pay

## 2023-12-08 ENCOUNTER — Other Ambulatory Visit: Payer: Self-pay

## 2023-12-09 ENCOUNTER — Other Ambulatory Visit: Payer: Self-pay

## 2023-12-11 ENCOUNTER — Other Ambulatory Visit: Payer: Self-pay

## 2023-12-11 MED ORDER — KETOCONAZOLE 2 % EX SHAM
1.0000 | MEDICATED_SHAMPOO | CUTANEOUS | 3 refills | Status: AC
  Filled 2023-12-11: qty 120, 28d supply, fill #0
  Filled 2024-04-09: qty 120, 28d supply, fill #1
  Filled 2024-08-17: qty 120, 28d supply, fill #2

## 2024-01-08 DIAGNOSIS — H52223 Regular astigmatism, bilateral: Secondary | ICD-10-CM | POA: Diagnosis not present

## 2024-01-08 DIAGNOSIS — Q141 Congenital malformation of retina: Secondary | ICD-10-CM | POA: Diagnosis not present

## 2024-01-08 DIAGNOSIS — H5213 Myopia, bilateral: Secondary | ICD-10-CM | POA: Diagnosis not present

## 2024-01-08 DIAGNOSIS — H524 Presbyopia: Secondary | ICD-10-CM | POA: Diagnosis not present

## 2024-01-20 ENCOUNTER — Encounter: Payer: Self-pay | Admitting: Internal Medicine

## 2024-01-20 ENCOUNTER — Ambulatory Visit (INDEPENDENT_AMBULATORY_CARE_PROVIDER_SITE_OTHER): Payer: Self-pay | Admitting: Internal Medicine

## 2024-01-20 ENCOUNTER — Other Ambulatory Visit (HOSPITAL_COMMUNITY): Payer: Self-pay

## 2024-01-20 ENCOUNTER — Other Ambulatory Visit: Payer: Self-pay

## 2024-01-20 VITALS — BP 114/72 | HR 68 | Ht 71.0 in | Wt 168.5 lb

## 2024-01-20 DIAGNOSIS — E785 Hyperlipidemia, unspecified: Secondary | ICD-10-CM | POA: Diagnosis not present

## 2024-01-20 DIAGNOSIS — Z125 Encounter for screening for malignant neoplasm of prostate: Secondary | ICD-10-CM

## 2024-01-20 DIAGNOSIS — Z0000 Encounter for general adult medical examination without abnormal findings: Secondary | ICD-10-CM | POA: Diagnosis not present

## 2024-01-20 DIAGNOSIS — K648 Other hemorrhoids: Secondary | ICD-10-CM | POA: Diagnosis not present

## 2024-01-20 DIAGNOSIS — N528 Other male erectile dysfunction: Secondary | ICD-10-CM | POA: Diagnosis not present

## 2024-01-20 DIAGNOSIS — L209 Atopic dermatitis, unspecified: Secondary | ICD-10-CM | POA: Diagnosis not present

## 2024-01-20 DIAGNOSIS — N529 Male erectile dysfunction, unspecified: Secondary | ICD-10-CM

## 2024-01-20 MED ORDER — HYDROCORTISONE (PERIANAL) 2.5 % EX CREA
1.0000 | TOPICAL_CREAM | Freq: Two times a day (BID) | CUTANEOUS | 2 refills | Status: AC
  Filled 2024-01-20: qty 30, 10d supply, fill #0

## 2024-01-20 MED ORDER — SILDENAFIL CITRATE 20 MG PO TABS
20.0000 mg | ORAL_TABLET | Freq: Every day | ORAL | 5 refills | Status: AC | PRN
  Filled 2024-01-20: qty 10, 10d supply, fill #0
  Filled 2024-05-13: qty 10, 10d supply, fill #1
  Filled 2024-08-20: qty 10, 10d supply, fill #2

## 2024-01-20 NOTE — Progress Notes (Signed)
 Date:  01/20/2024   Name:  Hunter Morgan   DOB:  05/28/1961   MRN:  [id number]   Chief Complaint: Annual Exam Hunter Morgan is a 63 y.o. male who presents today for his Complete Annual Exam. He feels well. He reports exercising walks 5 days a week, 20 plus miles, weight lifting.Marland Kitchen He reports he is sleeping fairly well.   Health Maintenance  Topic Date Due   HIV Screening  Never done   COVID-19 Vaccine (6 - 2024-25 season) 02/05/2024*   Flu Shot  05/15/2024   DTaP/Tdap/Td vaccine (2 - Td or Tdap) 11/26/2026   Colon Cancer Screening  01/27/2032   Zoster (Shingles) Vaccine  Completed   Hepatitis C Screening  Addressed   HPV Vaccine  Aged Out  *Topic was postponed. The date shown is not the original due date.    Lab Results  Component Value Date   PSA1 0.8 01/17/2023   PSA1 1.0 12/14/2021   PSA1 0.8 12/08/2020   PSA 0.9 06/24/2014    Rash This is a recurrent (atopic dermatitis) problem. The affected locations include the face and genitalia. Pertinent negatives include no fatigue or shortness of breath.  Rectal discomfort - no mass or bleeding - itching and discomfort with defection and wiping.  Had colonoscopy last year - internal hemorrhoids noted.  Prep H no benefit.   Review of Systems  Constitutional:  Negative for fatigue and unexpected weight change.  HENT:  Negative for nosebleeds and trouble swallowing.   Eyes:  Negative for visual disturbance.  Respiratory:  Negative for chest tightness, shortness of breath and wheezing.   Cardiovascular:  Negative for palpitations and leg swelling.  Genitourinary:  Negative for dysuria and urgency.       Rectal discomfort   Musculoskeletal:  Negative for arthralgias, gait problem and joint swelling.  Skin:  Positive for color change (loss of pigment) and rash.  Neurological:  Positive for numbness. Negative for dizziness, weakness and light-headedness. Seizures: in both arms while sleeping - resolved a month  ago. Psychiatric/Behavioral:  Negative for dysphoric mood and sleep disturbance. The patient is not nervous/anxious.      Lab Results  Component Value Date   NA 138 01/17/2023   K 4.3 01/17/2023   CO2 22 01/17/2023   GLUCOSE 84 01/17/2023   BUN 20 01/17/2023   CREATININE 0.82 01/17/2023   CALCIUM 9.2 01/17/2023   EGFR 100 01/17/2023   GFRNONAA 96 12/08/2020   Lab Results  Component Value Date   CHOL 168 01/17/2023   HDL 67 01/17/2023   LDLCALC 92 01/17/2023   TRIG 41 01/17/2023   CHOLHDL 2.5 01/17/2023   Lab Results  Component Value Date   TSH 1.280 01/17/2023   No results found for: "HGBA1C" Lab Results  Component Value Date   WBC 5.8 01/17/2023   HGB 14.3 01/17/2023   HCT 42.5 01/17/2023   MCV 100 (H) 01/17/2023   PLT 240 01/17/2023   Lab Results  Component Value Date   ALT 19 01/17/2023   AST 33 01/17/2023   ALKPHOS 68 01/17/2023   BILITOT 0.9 01/17/2023   No results found for: "25OHVITD2", "25OHVITD3", "VD25OH"   Patient Active Problem List   Diagnosis Date Noted   Atopic dermatitis in adult 01/20/2024   Colon cancer screening    Erectile dysfunction 06/11/2019   Environmental and seasonal allergies 09/20/2015   H/O motion sickness 09/20/2015   Hyperlipidemia, mild 09/20/2015    No Known Allergies  Past  Surgical History:  Procedure Laterality Date   COLONOSCOPY  05/2012   normal   COLONOSCOPY WITH PROPOFOL N/A 01/26/2022   Procedure: COLONOSCOPY WITH PROPOFOL;  Surgeon: Midge Minium, MD;  Location[facility];  Service: Endoscopy;  Laterality: N/A;   GANGLION CYST EXCISION Left    wrist   KNEE SURGERY Left 2005   VASECTOMY  2001    Social History   Tobacco Use   Smoking status: Never   Smokeless tobacco: Never  Vaping Use   Vaping status: Never Used  Substance Use Topics   Alcohol use: Yes    Alcohol/week: 7.0 standard drinks of alcohol    Types: 7 Standard drinks or equivalent per week   Drug use: No     Medication list  has been reviewed and updated.  Current Meds  Medication Sig   hydrocortisone (ANUSOL-HC) 2.5 % rectal cream Place 1 Application rectally 2 (two) times daily.   hydrocortisone 2.5 % cream Apply a thin layer to the affected areas twice daily. Decrease use as improved.   ketoconazole (NIZORAL) 2 % cream Apply 1 Application topically daily.   ketoconazole (NIZORAL) 2 % shampoo Wash affected area 2 (two) to 3 (three) times a week. Leave on for 5-10 min before rinsing.   Roflumilast, Antiseborrheic, (ZORYVE) 0.3 % FOAM Apply 1 Application topically daily.   [DISCONTINUED] sildenafil (REVATIO) 20 MG tablet Take 1 tablet (20 mg total) by mouth daily as needed.       01/20/2024    8:05 AM 01/17/2023   11:01 AM 12/14/2021    3:05 PM 12/08/2020    8:14 AM  GAD 7 : Generalized Anxiety Score  Nervous, Anxious, on Edge 0 0 0 0  Control/stop worrying 0 0 0 0  Worry too much - different things 0 0 0 0  Trouble relaxing 0 0 0 0  Restless 0 0 0 0  Easily annoyed or irritable 0 0 0 0  Afraid - awful might happen 0 0 0 0  Total GAD 7 Score 0 0 0 0  Anxiety Difficulty Not difficult at all Not difficult at all         01/20/2024    8:05 AM 01/17/2023   11:00 AM 12/14/2021    3:05 PM  Depression screen PHQ 2/9  Decreased Interest 0 0 0  Down, Depressed, Hopeless 0 0 0  PHQ - 2 Score 0 0 0  Altered sleeping 0 0 1  Tired, decreased energy 0 0 1  Change in appetite 0 0 0  Feeling bad or failure about yourself  0 0 0  Trouble concentrating 0 0 0  Moving slowly or fidgety/restless 0 0 0  Suicidal thoughts 0 0 0  PHQ-9 Score 0 0 2  Difficult doing work/chores Not difficult at all Not difficult at all     BP Readings from Last 3 Encounters:  01/20/24 114/72  01/17/23 118/68  01/26/22 95/70    Physical Exam Vitals and nursing note reviewed.  Constitutional:      Appearance: Normal appearance. He is well-developed.  HENT:     Head: Normocephalic.     Right Ear: Tympanic membrane, ear canal and  external ear normal.     Left Ear: Tympanic membrane, ear canal and external ear normal.     Nose: Nose normal.  Eyes:     Conjunctiva/sclera: Conjunctivae normal.     Pupils: Pupils are equal, round, and reactive to light.  Neck:     Thyroid: No  thyromegaly.     Vascular: No carotid bruit.  Cardiovascular:     Rate and Rhythm: Normal rate and regular rhythm.     Heart sounds: Normal heart sounds.  Pulmonary:     Effort: Pulmonary effort is normal.     Breath sounds: Normal breath sounds. No wheezing.  Chest:  Breasts:    Right: No mass.     Left: No mass.  Abdominal:     General: Bowel sounds are normal.     Palpations: Abdomen is soft.     Tenderness: There is no abdominal tenderness.  Genitourinary:    Comments: Rectal deferred Musculoskeletal:        General: Normal range of motion.     Cervical back: Normal range of motion and neck supple.  Lymphadenopathy:     Cervical: No cervical adenopathy.  Skin:    General: Skin is warm and dry.     Capillary Refill: Capillary refill takes less than 2 seconds.  Neurological:     General: No focal deficit present.     Mental Status: He is alert and oriented to person, place, and time.     Deep Tendon Reflexes: Reflexes are normal and symmetric.  Psychiatric:        Attention and Perception: Attention normal.        Mood and Affect: Mood normal.        Thought Content: Thought content normal.     Wt Readings from Last 3 Encounters:  01/20/24 168 lb 8 oz (76.4 kg)  01/17/23 165 lb (74.8 kg)  01/26/22 162 lb 1.6 oz (73.5 kg)    BP 114/72   Pulse 68   Ht 5\' 11"  (1.803 m)   Wt 168 lb 8 oz (76.4 kg)   SpO2 97%   BMI 23.50 kg/m   Assessment and Plan:  Problem List Items Addressed This Visit       Unprioritized   Hyperlipidemia, mild   Relevant Medications   sildenafil (REVATIO) 20 MG tablet   Other Relevant Orders   Comprehensive metabolic panel with GFR   Lipid panel   Erectile dysfunction   Relevant  Medications   sildenafil (REVATIO) 20 MG tablet   Other Relevant Orders   CBC with Differential/Platelet   Urinalysis, Routine w reflex microscopic   Atopic dermatitis in adult   Doing well on Zoryve      Other Visit Diagnoses       Annual physical exam    -  Primary   transient numbness in both arms while sleeping suggests nerve impingement if recurrent - return for eval can try Advil or Aleve at HS, heat   Relevant Orders   CBC with Differential/Platelet   Comprehensive metabolic panel with GFR   Lipid panel   PSA   Urinalysis, Routine w reflex microscopic     Prostate cancer screening       Relevant Orders   PSA     Internal hemorrhoids without complication       trial of rectal hydrocortisone bid   Relevant Medications   hydrocortisone (ANUSOL-HC) 2.5 % rectal cream   sildenafil (REVATIO) 20 MG tablet       No follow-ups on file.    Reubin Milan, MD [facility] [place]

## 2024-01-20 NOTE — Assessment & Plan Note (Signed)
 Doing well on Zoryve

## 2024-01-21 ENCOUNTER — Encounter: Payer: Self-pay | Admitting: Internal Medicine

## 2024-01-21 LAB — CBC WITH DIFFERENTIAL/PLATELET
Basophils Absolute: 0 x10E3/uL (ref 0.0–0.2)
Basos: 1 %
EOS (ABSOLUTE): 0.2 x10E3/uL (ref 0.0–0.4)
Eos: 4 %
Hematocrit: 45.9 % (ref 37.5–51.0)
Hemoglobin: 15.5 g/dL (ref 13.0–17.7)
Immature Grans (Abs): 0 x10E3/uL (ref 0.0–0.1)
Immature Granulocytes: 0 %
Lymphocytes Absolute: 1.9 x10E3/uL (ref 0.7–3.1)
Lymphs: 31 %
MCH: 34.1 pg — ABNORMAL HIGH (ref 26.6–33.0)
MCHC: 33.8 g/dL (ref 31.5–35.7)
MCV: 101 fL — ABNORMAL HIGH (ref 79–97)
Monocytes Absolute: 0.7 x10E3/uL (ref 0.1–0.9)
Monocytes: 11 %
Neutrophils Absolute: 3.2 x10E3/uL (ref 1.4–7.0)
Neutrophils: 53 %
Platelets: 302 x10E3/uL (ref 150–450)
RBC: 4.55 x10E6/uL (ref 4.14–5.80)
RDW: 12.6 % (ref 11.6–15.4)
WBC: 6 x10E3/uL (ref 3.4–10.8)

## 2024-01-21 LAB — URINALYSIS, ROUTINE W REFLEX MICROSCOPIC
Bilirubin, UA: NEGATIVE
Glucose, UA: NEGATIVE
Ketones, UA: NEGATIVE
Leukocytes,UA: NEGATIVE
Nitrite, UA: NEGATIVE
Protein,UA: NEGATIVE
RBC, UA: NEGATIVE
Specific Gravity, UA: 1.027 (ref 1.005–1.030)
Urobilinogen, Ur: 0.2 mg/dL (ref 0.2–1.0)
pH, UA: 5.5 (ref 5.0–7.5)

## 2024-01-21 LAB — LIPID PANEL
Chol/HDL Ratio: 3.2 ratio (ref 0.0–5.0)
Cholesterol, Total: 198 mg/dL (ref 100–199)
HDL: 62 mg/dL
LDL Chol Calc (NIH): 122 mg/dL — ABNORMAL HIGH (ref 0–99)
Triglycerides: 79 mg/dL (ref 0–149)
VLDL Cholesterol Cal: 14 mg/dL (ref 5–40)

## 2024-01-21 LAB — COMPREHENSIVE METABOLIC PANEL WITH GFR
ALT: 16 IU/L (ref 0–44)
AST: 23 IU/L (ref 0–40)
Albumin: 4.8 g/dL (ref 3.9–4.9)
Alkaline Phosphatase: 79 IU/L (ref 44–121)
BUN/Creatinine Ratio: 18 (ref 10–24)
BUN: 17 mg/dL (ref 8–27)
Bilirubin Total: 0.9 mg/dL (ref 0.0–1.2)
CO2: 23 mmol/L (ref 20–29)
Calcium: 9.7 mg/dL (ref 8.6–10.2)
Chloride: 102 mmol/L (ref 96–106)
Creatinine, Ser: 0.94 mg/dL (ref 0.76–1.27)
Globulin, Total: 2.7 g/dL (ref 1.5–4.5)
Glucose: 98 mg/dL (ref 70–99)
Potassium: 4.8 mmol/L (ref 3.5–5.2)
Sodium: 140 mmol/L (ref 134–144)
Total Protein: 7.5 g/dL (ref 6.0–8.5)
eGFR: 92 mL/min/{1.73_m2} (ref >59)

## 2024-01-21 LAB — PSA: Prostate Specific Ag, Serum: 1.1 ng/mL (ref 0.0–4.0)

## 2024-04-09 ENCOUNTER — Other Ambulatory Visit: Payer: Self-pay

## 2024-04-14 ENCOUNTER — Other Ambulatory Visit: Payer: Self-pay

## 2024-04-15 ENCOUNTER — Other Ambulatory Visit: Payer: Self-pay

## 2024-05-13 ENCOUNTER — Other Ambulatory Visit: Payer: Self-pay

## 2024-07-22 ENCOUNTER — Ambulatory Visit: Payer: Self-pay | Admitting: Internal Medicine

## 2024-07-22 NOTE — Telephone Encounter (Signed)
 Noted  Pt has appt.  KP

## 2024-07-22 NOTE — Telephone Encounter (Signed)
 FYI Only or Action Required?: FYI only for provider.  Patient was last seen in primary care on 01/20/2024 by Justus Leita DEL, MD.  Called Nurse Triage reporting Abdominal Pain.  Symptoms began several weeks ago.  Symptoms are: gradually improving.  Triage Disposition: See Physician Within 24 Hours  Patient/caregiver understands and will follow disposition?: Yes            Copied from CRM #[id number]. Topic: Clinical - Red Word Triage >> Jul 22, 2024 11:59 AM Willma SAUNDERS wrote: [facility] that prompted transfer to Nurse Triage: Patient thinks he may have gull stones, for the last 2 weeks has had pain in his stomach/upper right side, heaviness in his stomach, difficulty sleeping, uncomfortable when first laying down and unable to lay on his back without severe pain. Reason for Disposition  [1] MODERATE pain (e.g., interferes with normal activities) AND [2] pain comes and goes (cramps) AND [3] present > 24 hours  (Exception: Pain with Vomiting or Diarrhea - see that Guideline.)  Answer Assessment - Initial Assessment Questions This RN scheduled pt for an appt tomorrow morning with PCP in office. Pt requests this so he can have fasting blood work. This RN educated pt new-worsening symptoms and when to call back/seek emergent care. Pt verbalized understanding and agrees to plan.    Pt states he has lost 10-11 lbs in 3 days (now low 150s lb) Dull upper right abdomen pain intermittent; worsens in evening Pt has back pain when laying down flat Denies fever, vomiting, diarrhea, blood in bowel movements Bowel movements- inconsistent from the normal; more loose stools very smooth, much more consistent form; very dark color after trip, now normal color Pt states he eats a healthy diet  ONSET: When did the pain begin? (Minutes, hours or days ago)      Two weeks ago after getting back from vacation  SUDDEN: Gradual or sudden onset?     Intensified over first few days  PATTERN Does  the pain come and go, or is it constant?     When moving around throughout day he can feel it when turning/bending/compressing abdomen; hasn't been excruciating except more intense at night  SEVERITY: How bad is the pain?  (e.g., Scale 1-10; mild, moderate, or severe)     It is waking pt up, 7/10 pain level at night until changes position and gets back to sleep  RECURRENT SYMPTOM: Have you ever had this type of stomach pain before? If Yes, ask: When was the last time? and What happened that time?      Yes 6-7 years ago but went away in a few days  CAUSE: What do you think is causing the stomach pain? (e.g., gallstones, recent abdominal surgery)     Might have been triggered by pt eating a high fat meal  Protocols used: Abdominal Pain - Male-A-AH

## 2024-07-23 ENCOUNTER — Encounter: Payer: Self-pay | Admitting: Internal Medicine

## 2024-07-23 ENCOUNTER — Ambulatory Visit: Admitting: Internal Medicine

## 2024-07-23 VITALS — BP 110/70 | HR 67 | Ht 71.0 in | Wt 159.0 lb

## 2024-07-23 DIAGNOSIS — R1011 Right upper quadrant pain: Secondary | ICD-10-CM | POA: Diagnosis not present

## 2024-07-23 NOTE — Progress Notes (Signed)
 Date:  07/23/2024   Name:  Hunter Morgan   DOB:  1961/09/11   MRN:  [id number]   Chief Complaint: Abdominal Pain (Right Upper Abdominal Pain. Intermittent. Wakes him up at night. )  Abdominal Pain This is a new problem. The current episode started 1 to 4 weeks ago. The onset quality is undetermined. The problem occurs every several days. The pain is located in the RUQ. The quality of the pain is a sensation of fullness and aching. The abdominal pain radiates to the RUQ. Pertinent negatives include no belching, constipation, diarrhea, fever, nausea, vomiting or weight loss. Nothing aggravates the pain.    Review of Systems  Constitutional:  Negative for chills, fatigue, fever, unexpected weight change and weight loss.  Respiratory:  Negative for chest tightness and shortness of breath.   Cardiovascular:  Negative for chest pain.  Gastrointestinal:  Positive for abdominal pain. Negative for abdominal distention, blood in stool, constipation, diarrhea, nausea and vomiting.  Psychiatric/Behavioral:  Negative for dysphoric mood and sleep disturbance. The patient is not nervous/anxious.      Lab Results  Component Value Date   NA 140 01/20/2024   K 4.8 01/20/2024   CO2 23 01/20/2024   GLUCOSE 98 01/20/2024   BUN 17 01/20/2024   CREATININE 0.94 01/20/2024   CALCIUM 9.7 01/20/2024   EGFR 92 01/20/2024   GFRNONAA 96 12/08/2020   Lab Results  Component Value Date   CHOL 198 01/20/2024   HDL 62 01/20/2024   LDLCALC 122 (H) 01/20/2024   TRIG 79 01/20/2024   CHOLHDL 3.2 01/20/2024   Lab Results  Component Value Date   TSH 1.280 01/17/2023   No results found for: HGBA1C Lab Results  Component Value Date   WBC 6.0 01/20/2024   HGB 15.5 01/20/2024   HCT 45.9 01/20/2024   MCV 101 (H) 01/20/2024   PLT 302 01/20/2024   Lab Results  Component Value Date   ALT 16 01/20/2024   AST 23 01/20/2024   ALKPHOS 79 01/20/2024   BILITOT 0.9 01/20/2024   No results found for:  MARIEN BOLLS, VD25OH   Patient Active Problem List   Diagnosis Date Noted   Atopic dermatitis in adult 01/20/2024   Colon cancer screening    Erectile dysfunction 06/11/2019   Environmental and seasonal allergies 09/20/2015   H/O motion sickness 09/20/2015   Hyperlipidemia, mild 09/20/2015    No Known Allergies  Past Surgical History:  Procedure Laterality Date   COLONOSCOPY  05/2012   normal   COLONOSCOPY WITH PROPOFOL  N/A 01/26/2022   Procedure: COLONOSCOPY WITH PROPOFOL ;  Surgeon: Jinny Carmine, MD;  [facility];  Service: Endoscopy;  Laterality: N/A;   GANGLION CYST EXCISION Left    wrist   KNEE SURGERY Left 2005   VASECTOMY  2001    Social History   Tobacco Use   Smoking status: Never   Smokeless tobacco: Never  Vaping Use   Vaping status: Never Used  Substance Use Topics   Alcohol use: Yes    Alcohol/week: 7.0 standard drinks of alcohol    Types: 7 Standard drinks or equivalent per week   Drug use: No     Medication list has been reviewed and updated.  Current Meds  Medication Sig   hydrocortisone  (ANUSOL -HC) 2.5 % rectal cream Place 1 Application rectally 2 (two) times daily.   hydrocortisone  2.5 % cream Apply a thin layer to the affected areas twice daily. Decrease use as improved.   ketoconazole  (  NIZORAL ) 2 % cream Apply 1 Application topically daily.   ketoconazole  (NIZORAL ) 2 % shampoo Wash affected area 2 (two) to 3 (three) times a week. Leave on for 5-10 min before rinsing.   Roflumilast  (ZORYVE ) 0.3 % FOAM Apply 1 Application topically daily.   sildenafil  (REVATIO ) 20 MG tablet Take 1 tablet (20 mg total) by mouth daily as needed.       07/23/2024    9:00 AM 01/20/2024    8:05 AM 01/17/2023   11:01 AM 12/14/2021    3:05 PM  GAD 7 : Generalized Anxiety Score  Nervous, Anxious, on Edge 0 0 0 0  Control/stop worrying 0 0 0 0  Worry too much - different things 0 0 0 0  Trouble relaxing 0 0 0 0  Restless 0 0 0 0  Easily  annoyed or irritable 0 0 0 0  Afraid - awful might happen 0 0 0 0  Total GAD 7 Score 0 0 0 0  Anxiety Difficulty Not difficult at all Not difficult at all Not difficult at all        07/23/2024    9:00 AM 01/20/2024    8:05 AM 01/17/2023   11:00 AM  Depression screen PHQ 2/9  Decreased Interest 0 0 0  Down, Depressed, Hopeless 0 0 0  PHQ - 2 Score 0 0 0  Altered sleeping 0 0 0  Tired, decreased energy 0 0 0  Change in appetite 0 0 0  Feeling bad or failure about yourself  0 0 0  Trouble concentrating 0 0 0  Moving slowly or fidgety/restless 0 0 0  Suicidal thoughts 0 0 0  PHQ-9 Score 0 0 0  Difficult doing work/chores Not difficult at all Not difficult at all Not difficult at all    BP Readings from Last 3 Encounters:  07/23/24 110/70  01/20/24 114/72  01/17/23 118/68    Physical Exam Vitals and nursing note reviewed.  Constitutional:      General: He is not in acute distress.    Appearance: He is well-developed.  HENT:     Head: Normocephalic and atraumatic.  Pulmonary:     Effort: Pulmonary effort is normal. No respiratory distress.  Abdominal:     General: Abdomen is flat. Bowel sounds are normal. There is no distension or abdominal bruit.     Palpations: Abdomen is soft. There is no hepatomegaly or splenomegaly.     Tenderness: There is abdominal tenderness (mild to deep palpation) in the right upper quadrant. There is no right CVA tenderness or left CVA tenderness.  Skin:    General: Skin is warm and dry.     Findings: No rash.  Neurological:     Mental Status: He is alert and oriented to person, place, and time.  Psychiatric:        Mood and Affect: Mood normal.        Behavior: Behavior normal.     Wt Readings from Last 3 Encounters:  07/23/24 159 lb (72.1 kg)  01/20/24 168 lb 8 oz (76.4 kg)  01/17/23 165 lb (74.8 kg)    BP 110/70   Pulse 67   Ht 5' 11 (1.803 m)   Wt 159 lb (72.1 kg)   SpO2 99%   BMI 22.18 kg/m   Assessment and Plan:  Problem  List Items Addressed This Visit   None Visit Diagnoses       Colicky RUQ abdominal pain    -  Primary  suspicious for gall bladder disease will get US  to evaluate   Relevant Orders   US  Abdomen Limited RUQ (LIVER/GB)       No follow-ups on file.    Leita HILARIO Adie, MD [facility] Medicine [place]

## 2024-07-27 ENCOUNTER — Ambulatory Visit
Admission: RE | Admit: 2024-07-27 | Discharge: 2024-07-27 | Disposition: A | Discharge status: Home / Self Care | Source: Ambulatory Visit | Attending: Internal Medicine | Admitting: Internal Medicine

## 2024-07-27 ENCOUNTER — Ambulatory Visit: Payer: Self-pay | Admitting: Internal Medicine

## 2024-07-27 DIAGNOSIS — K7689 Other specified diseases of liver: Secondary | ICD-10-CM | POA: Diagnosis not present

## 2024-07-27 DIAGNOSIS — R1011 Right upper quadrant pain: Secondary | ICD-10-CM | POA: Diagnosis not present

## 2024-08-17 ENCOUNTER — Other Ambulatory Visit: Payer: Self-pay

## 2024-08-20 ENCOUNTER — Other Ambulatory Visit: Payer: Self-pay

## 2025-02-04 ENCOUNTER — Encounter: Admitting: Family Medicine
